# Patient Record
Sex: Female | Born: 1990 | Race: White | Hispanic: No | Marital: Married | Smoking: Never smoker
Health system: Southern US, Community
[De-identification: ages and names within clinical notes are randomized; demographics above are authoritative.]

## PROBLEM LIST (undated history)

## (undated) DIAGNOSIS — O021 Missed abortion: Secondary | ICD-10-CM

## (undated) DIAGNOSIS — Z973 Presence of spectacles and contact lenses: Secondary | ICD-10-CM

## (undated) HISTORY — PX: MOUTH SURGERY: SHX715

## (undated) HISTORY — PX: SHOULDER SURGERY: SHX246

---

## 2011-09-06 ENCOUNTER — Emergency Department (HOSPITAL_BASED_OUTPATIENT_CLINIC_OR_DEPARTMENT_OTHER)
Admission: EM | Admit: 2011-09-06 | Discharge: 2011-09-06 | Disposition: A | Discharge status: Home / Self Care | Payer: No Typology Code available for payment source | Attending: Emergency Medicine | Admitting: Emergency Medicine

## 2011-09-06 ENCOUNTER — Emergency Department (HOSPITAL_BASED_OUTPATIENT_CLINIC_OR_DEPARTMENT_OTHER): Payer: No Typology Code available for payment source

## 2011-09-06 ENCOUNTER — Encounter (HOSPITAL_BASED_OUTPATIENT_CLINIC_OR_DEPARTMENT_OTHER): Payer: Self-pay | Admitting: Emergency Medicine

## 2011-09-06 DIAGNOSIS — S52613A Displaced fracture of unspecified ulna styloid process, initial encounter for closed fracture: Secondary | ICD-10-CM

## 2011-09-06 DIAGNOSIS — Y93I9 Activity, other involving external motion: Secondary | ICD-10-CM | POA: Insufficient documentation

## 2011-09-06 DIAGNOSIS — Y998 Other external cause status: Secondary | ICD-10-CM | POA: Insufficient documentation

## 2011-09-06 DIAGNOSIS — S52609A Unspecified fracture of lower end of unspecified ulna, initial encounter for closed fracture: Secondary | ICD-10-CM | POA: Insufficient documentation

## 2011-09-06 MED ORDER — HYDROCODONE-ACETAMINOPHEN 5-325 MG PO TABS: ORAL_TABLET | ORAL | Status: DC

## 2011-09-06 MED ORDER — IBUPROFEN 800 MG PO TABS: 800.0000 mg | ORAL_TABLET | Freq: Three times a day (TID) | ORAL | Status: AC | PRN

## 2011-09-06 MED ORDER — IBUPROFEN 800 MG PO TABS
800.0000 mg | ORAL_TABLET | Freq: Once | ORAL | Status: AC
  Administered 2011-09-06: 800 mg via ORAL
  Filled 2011-09-06: qty 1

## 2011-09-06 NOTE — ED Notes (Signed)
Patient transported to X-ray 
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
## 2013-01-09 HISTORY — PX: SHOULDER SURGERY: SHX246
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
## 2014-03-25 ENCOUNTER — Emergency Department (HOSPITAL_BASED_OUTPATIENT_CLINIC_OR_DEPARTMENT_OTHER): Payer: BC Managed Care – PPO

## 2014-03-25 ENCOUNTER — Emergency Department (HOSPITAL_BASED_OUTPATIENT_CLINIC_OR_DEPARTMENT_OTHER)
Admission: EM | Admit: 2014-03-25 | Discharge: 2014-03-25 | Disposition: A | Discharge status: Home / Self Care | Payer: BC Managed Care – PPO | Attending: Emergency Medicine | Admitting: Emergency Medicine

## 2014-03-25 ENCOUNTER — Encounter (HOSPITAL_BASED_OUTPATIENT_CLINIC_OR_DEPARTMENT_OTHER): Payer: Self-pay

## 2014-03-25 DIAGNOSIS — W548XXA Other contact with dog, initial encounter: Secondary | ICD-10-CM | POA: Insufficient documentation

## 2014-03-25 DIAGNOSIS — Y93K1 Activity, walking an animal: Secondary | ICD-10-CM | POA: Diagnosis not present

## 2014-03-25 DIAGNOSIS — Z79899 Other long term (current) drug therapy: Secondary | ICD-10-CM | POA: Diagnosis not present

## 2014-03-25 DIAGNOSIS — S4991XA Unspecified injury of right shoulder and upper arm, initial encounter: Secondary | ICD-10-CM | POA: Diagnosis present

## 2014-03-25 DIAGNOSIS — S43101A Unspecified dislocation of right acromioclavicular joint, initial encounter: Secondary | ICD-10-CM

## 2014-03-25 DIAGNOSIS — Y9289 Other specified places as the place of occurrence of the external cause: Secondary | ICD-10-CM | POA: Diagnosis not present

## 2014-03-25 DIAGNOSIS — Y998 Other external cause status: Secondary | ICD-10-CM | POA: Insufficient documentation

## 2014-03-25 DIAGNOSIS — S43121A Dislocation of right acromioclavicular joint, 100%-200% displacement, initial encounter: Secondary | ICD-10-CM | POA: Insufficient documentation

## 2014-03-25 DIAGNOSIS — W19XXXA Unspecified fall, initial encounter: Secondary | ICD-10-CM

## 2014-03-25 MED ORDER — HYDROCODONE-ACETAMINOPHEN 5-325 MG PO TABS
1.0000 | ORAL_TABLET | Freq: Once | ORAL | Status: AC
  Administered 2014-03-25: 1 via ORAL
  Filled 2014-03-25: qty 1

## 2014-03-25 MED ORDER — HYDROCODONE-ACETAMINOPHEN 5-325 MG PO TABS: ORAL_TABLET | ORAL | Status: DC

## 2014-03-25 NOTE — Discharge Instructions (Signed)
Acromioclavicular Injuries °The AC (acromioclavicular) joint is the joint in the shoulder where the collarbone (clavicle) meets the shoulder blade (scapula). The part of the shoulder blade connected to the collarbone is called the acromion. Common problems with and treatments for the AC joint are detailed below. °ARTHRITIS °Arthritis occurs when the joint has been injured and the smooth padding between the joints (cartilage) is lost. This is the wear and tear seen in most joints of the body if they have been overused. This causes the joint to produce pain and swelling which is worse with activity.  °AC JOINT SEPARATION °AC joint separation means that the ligaments connecting the acromion of the shoulder blade and collarbone have been damaged, and the two bones no longer line up. AC separations can be anywhere from mild to severe, and are "graded" depending upon which ligaments are torn and how badly they are torn. °· Grade I Injury: the least damage is done, and the AC joint still lines up. °· Grade II Injury: damage to the ligaments which reinforce the AC joint. In a Grade II injury, these ligaments are stretched but not entirely torn. When stressed, the AC joint becomes painful and unstable. °· Grade III Injury: AC and secondary ligaments are completely torn, and the collarbone is no longer attached to the shoulder blade. This results in deformity; a prominence of the end of the clavicle. °AC JOINT FRACTURE °AC joint fracture means that there has been a break in the bones of the AC joint, usually the end of the clavicle. °TREATMENT °TREATMENT OF AC ARTHRITIS °· There is currently no way to replace the cartilage damaged by arthritis. The best way to improve the condition is to decrease the activities which aggravate the problem. Application of ice to the joint helps decrease pain and soreness (inflammation). The use of non-steroidal anti-inflammatory medication is helpful. °· If less conservative measures do not  work, then cortisone shots (injections) may be used. These are anti-inflammatories; they decrease the soreness in the joint and swelling. °· If non-surgical measures fail, surgery may be recommended. The procedure is generally removal of a portion of the end of the clavicle. This is the part of the collarbone closest to your acromion which is stabilized with ligaments to the acromion of the shoulder blade. This surgery may be performed using a tube-like instrument with a light (arthroscope) for looking into a joint. It may also be performed as an open surgery through a small incision by the surgeon. Most patients will have good range of motion within 6 weeks and may return to all activity including sports by 8-12 weeks, barring complications. °TREATMENT OF AN AC SEPARATION °· The initial treatment is to decrease pain. This is best accomplished by immobilizing the arm in a sling and placing an ice pack to the shoulder for 20 to 30 minutes every 2 hours as needed. As the pain starts to subside, it is important to begin moving the fingers, wrist, elbow and eventually the shoulder in order to prevent a stiff or "frozen" shoulder. Instruction on when and how much to move the shoulder will be provided by your caregiver. The length of time needed to regain full motion and function depends on the amount or grade of the injury. Recovery from a Grade I AC separation usually takes 10 to 14 days, whereas a Grade III may take 6 to 8 weeks. °· Grade I and II separations usually do not require surgery. Even Grade III injuries usually allow return to full   activity with few restrictions. Treatment is also based on the activity demands of the injured shoulder. For example, a high level quarterback with an injured throwing arm will receive more aggressive treatment than someone with a desk job who rarely uses his/her arm for strenuous activities. In some cases, a painful lump may persist which could require a later surgery. Surgery  can be very successful, but the benefits must be weighed against the potential risks. °TREATMENT OF AN AC JOINT FRACTURE °Fracture treatment depends on the type of fracture. Sometimes a splint or sling may be all that is required. Other times surgery may be required for repair. This is more frequently the case when the ligaments supporting the clavicle are completely torn. Your caregiver will help you with these decisions and together you can decide what will be the best treatment. °HOME CARE INSTRUCTIONS  °· Apply ice to the injury for 15-20 minutes each hour while awake for 2 days. Put the ice in a plastic bag and place a towel between the bag of ice and skin. °· If a sling has been applied, wear it constantly for as long as directed by your caregiver, even at night. The sling or splint can be removed for bathing or showering or as directed. Be sure to keep the shoulder in the same place as when the sling is on. Do not lift the arm. °· If a figure-of-eight splint has been applied it should be tightened gently by another person every day. Tighten it enough to keep the shoulders held back. Allow enough room to place the index finger between the body and strap. Loosen the splint immediately if there is numbness or tingling in the hands. °· Take over-the-counter or prescription medicines for pain, discomfort or fever as directed by your caregiver. °· If you or your child has received a follow up appointment, it is very important to keep that appointment in order to avoid long term complications, chronic pain or disability. °SEEK MEDICAL CARE IF:  °· The pain is not relieved with medications. °· There is increased swelling or discoloration that continues to get worse rather than better. °· You or your child has been unable to follow up as instructed. °· There is progressive numbness and tingling in the arm, forearm or hand. °SEEK IMMEDIATE MEDICAL CARE IF:  °· The arm is numb, cold or pale. °· There is increasing pain  in the hand, forearm or fingers. °MAKE SURE YOU:  °· Understand these instructions. °· Will watch your condition. °· Will get help right away if you are not doing well or get worse. °Document Released: 10/05/2004 Document Revised: 03/20/2011 Document Reviewed: 03/30/2008 °[employer]® Patient Information ©2015 [employer]. This information is not intended to replace advice given to you by your health care provider. Make sure you discuss any questions you have with your health care provider. ° °

## 2014-03-25 NOTE — ED Notes (Signed)
MD at bedside. 

## 2014-03-25 NOTE — ED Provider Notes (Signed)
CSN: [id number]     Arrival date & time 03/25/14  2024 History  This chart was scribed for Paula Libra, MD by Chestine Spore, ED Scribe. The patient was seen in room MH12/MH12 at 11:04 PM.     Chief Complaint  Patient presents with  . Shoulder Injury     The history is provided by the patient. No language interpreter was used.    HPI Comments: Stephanie Fritz is a 24 y.o. female who presents to the Emergency Department complaining of right shoulder injury at 7:50 PM. Pt was walking her dog when he pulled her down. She is now having moderate to severe pain in the right shoulder, most notably in the [facility] joint, worse with palpation or attempted movement. There is a stepoff present. Pt is not able to move her right shoulder because of the pain. She has abrasions of both shoulders. She denies hitting her head, neck pain, and any other symptoms. Pt denies having an orthopedist.   History reviewed. No pertinent past medical history. History reviewed. No pertinent past surgical history. History reviewed. No pertinent family history. History  Substance Use Topics  . Smoking status: Never Smoker   . Smokeless tobacco: Not on file  . Alcohol Use: Yes   OB History    No data available     Review of Systems  Skin: Positive for wound (abrasions to bilateral shoulders).      Allergies  Review of patient's allergies indicates no known allergies.  Home Medications   Prior to Admission medications   Medication Sig Start Date End Date Taking? Authorizing Provider  Lisdexamfetamine Dimesylate (VYVANSE PO) Take by mouth.   Yes Historical Provider, MD  Norgestimate-Ethinyl Estradiol Triphasic (TRINESSA, 28,) 0.18/0.215/0.25 MG-35 MCG tablet Take 1 tablet by mouth daily.   Yes Historical Provider, MD  HYDROcodone-acetaminophen (NORCO/VICODIN) 5-325 MG per tablet Take 1-2 tabs po q 6 hours PRN pain 03/25/14   Charnell Peplinski, MD   BP 117/83 mmHg  Pulse 89  Temp(Src) 98.1 F (36.7 C) (Oral)  Resp  18  Ht  (1.651 m)  Wt 135 lb (61.236 kg)  BMI 22.47 kg/m2  SpO2 100%  LMP 03/01/2014  Physical Exam  General: Well-developed, well-nourished female in no acute distress; appearance consistent with age of record HENT: normocephalic; atraumatic Eyes: pupils equal, round and reactive to light; extraocular muscles intact Neck: supple. No C-spine tenderness.  Heart: regular rate and rhythm. Lungs: clear to auscultation bilaterally Abdomen: soft; nondistended; nontender; no masses or hepatosplenomegaly; bowel sounds present Extremities: No deformity; full range of motion; pulses normal Neurologic: Awake, alert and oriented; motor function intact in all extremities and symmetric; no facial droop Skin: Warm and dry. Abrasions over both shoulders right greater than left. Psychiatric: Flat affect   ED Course  Procedures (including critical care time) DIAGNOSTIC STUDIES: Oxygen Saturation is 100% on RA, normal by my interpretation.    COORDINATION OF CARE: 11:11 PM-Discussed treatment plan which includes referral to orthopedist, pain medication, right shoulder X-ray with pt at bedside and pt agreed to plan.    MDM  Nursing notes and vitals signs, including pulse oximetry, reviewed.  Summary of this visit's results, reviewed by myself:  Labs:  No results found for this or any previous visit (from the past 24 hour(s)).  Imaging Studies: Dg Shoulder Right  03/25/2014   CLINICAL DATA:  Pulled down by dog; right shoulder pain. Initial encounter.  EXAM: RIGHT SHOULDER - 2+ VIEW  COMPARISON:  None.  FINDINGS:  There is no evidence of fracture. There appears to be right acromioclavicular joint separation, with the distal right clavicle elevated just above the superior border of the acromion, reflecting a borderline [place] type III acromioclavicular joint injury.  The right humeral head is seated within the glenoid fossa. No significant soft tissue abnormalities are seen. The visualized  portions of the right lung are clear.  IMPRESSION: No evidence of fracture. Apparent borderline [place] type III acromioclavicular joint injury noted.   Electronically Signed   By: Roanna RaiderJeffery  Chang M.D.   On: 03/25/2014 21:16    Final diagnoses:  Acromioclavicular joint separation, type 3, right, initial encounter   I personally performed the services described in this documentation, which was scribed in my presence. The recorded information has been reviewed and is accurate.   Paula LibraJohn Skyelar Swigart, MD 03/25/14 [phone]

## 2014-03-25 NOTE — ED Notes (Signed)
Pt reports she was walking her dog when he pulled her down - pt now c/o right shoulder pain.
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
## 2015-06-22 LAB — OB RESULTS CONSOLE GC/CHLAMYDIA
CHLAMYDIA, DNA PROBE: NEGATIVE
GC PROBE AMP, GENITAL: NEGATIVE

## 2015-06-22 LAB — OB RESULTS CONSOLE HIV ANTIBODY (ROUTINE TESTING): HIV: NONREACTIVE

## 2015-06-22 LAB — OB RESULTS CONSOLE HEPATITIS B SURFACE ANTIGEN: HEP B S AG: NEGATIVE

## 2015-06-22 LAB — OB RESULTS CONSOLE RUBELLA ANTIBODY, IGM: Rubella: IMMUNE

## 2015-06-22 LAB — OB RESULTS CONSOLE RPR: RPR: NONREACTIVE

## 2015-12-08 ENCOUNTER — Encounter (HOSPITAL_COMMUNITY): Payer: Self-pay

## 2015-12-08 ENCOUNTER — Inpatient Hospital Stay (HOSPITAL_COMMUNITY)
Admission: AD | Admit: 2015-12-08 | Discharge: 2015-12-11 | DRG: 775 | Disposition: A | Discharge status: Home / Self Care | Payer: BC Managed Care – PPO | Source: Ambulatory Visit | Attending: Obstetrics and Gynecology | Admitting: Obstetrics and Gynecology

## 2015-12-08 DIAGNOSIS — O42919 Preterm premature rupture of membranes, unspecified as to length of time between rupture and onset of labor, unspecified trimester: Secondary | ICD-10-CM | POA: Diagnosis present

## 2015-12-08 DIAGNOSIS — O42913 Preterm premature rupture of membranes, unspecified as to length of time between rupture and onset of labor, third trimester: Principal | ICD-10-CM | POA: Diagnosis present

## 2015-12-08 DIAGNOSIS — Z3A36 36 weeks gestation of pregnancy: Secondary | ICD-10-CM

## 2015-12-08 LAB — URINE MICROSCOPIC-ADD ON

## 2015-12-08 LAB — URINALYSIS, ROUTINE W REFLEX MICROSCOPIC
BILIRUBIN URINE: NEGATIVE
Glucose, UA: NEGATIVE mg/dL
KETONES UR: NEGATIVE mg/dL
Leukocytes, UA: NEGATIVE
NITRITE: NEGATIVE
PROTEIN: 30 mg/dL — AB
Specific Gravity, Urine: <1.005 — ABNORMAL LOW (ref 1.005–1.030)
pH: 6 (ref 5.0–8.0)

## 2015-12-08 LAB — GROUP B STREP BY PCR: GROUP B STREP BY PCR: NEGATIVE

## 2015-12-08 LAB — OB RESULTS CONSOLE GBS: STREP GROUP B AG: NEGATIVE

## 2015-12-08 NOTE — MAU Note (Signed)
Pt states that she felt a gush of fluid about an hour ago-clear, and continues to leak. Denies contractions, vag bleeding. +FM

## 2015-12-08 NOTE — MAU Note (Signed)
Dr. Callahan at bedside. 

## 2015-12-08 NOTE — MAU Note (Signed)
Dr Claiborne Billingsallahan will come to see patient in MAU. Orders to collect rapid GBS given

## 2015-12-09 ENCOUNTER — Inpatient Hospital Stay (HOSPITAL_COMMUNITY): Payer: BC Managed Care – PPO | Admitting: Anesthesiology

## 2015-12-09 ENCOUNTER — Encounter (HOSPITAL_COMMUNITY): Payer: Self-pay | Admitting: Anesthesiology

## 2015-12-09 DIAGNOSIS — O42919 Preterm premature rupture of membranes, unspecified as to length of time between rupture and onset of labor, unspecified trimester: Secondary | ICD-10-CM | POA: Diagnosis present

## 2015-12-09 DIAGNOSIS — Z3A36 36 weeks gestation of pregnancy: Secondary | ICD-10-CM | POA: Diagnosis not present

## 2015-12-09 DIAGNOSIS — O42913 Preterm premature rupture of membranes, unspecified as to length of time between rupture and onset of labor, third trimester: Secondary | ICD-10-CM | POA: Diagnosis present

## 2015-12-09 LAB — CBC
HEMATOCRIT: 32.8 % — AB (ref 36.0–46.0)
Hemoglobin: 11.7 g/dL — ABNORMAL LOW (ref 12.0–15.0)
MCH: 32.1 pg (ref 26.0–34.0)
MCHC: 35.7 g/dL (ref 30.0–36.0)
MCV: 89.9 fL (ref 78.0–100.0)
PLATELETS: 207 10*3/uL (ref 150–400)
RBC: 3.65 MIL/uL — AB (ref 3.87–5.11)
RDW: 12.6 % (ref 11.5–15.5)
WBC: 10.9 10*3/uL — ABNORMAL HIGH (ref 4.0–10.5)

## 2015-12-09 LAB — ABO/RH: ABO/RH(D): AB POS

## 2015-12-09 LAB — TYPE AND SCREEN
ABO/RH(D): AB POS
Antibody Screen: NEGATIVE

## 2015-12-09 LAB — RPR: RPR Ser Ql: NONREACTIVE

## 2015-12-09 MED ORDER — IBUPROFEN 800 MG PO TABS
800.0000 mg | ORAL_TABLET | Freq: Three times a day (TID) | ORAL | Status: DC
  Administered 2015-12-09 – 2015-12-11 (×4): 800 mg via ORAL
  Filled 2015-12-09 (×7): qty 1

## 2015-12-09 MED ORDER — METHYLERGONOVINE MALEATE 0.2 MG/ML IJ SOLN: 0.2000 mg | INTRAMUSCULAR | Status: DC | PRN

## 2015-12-09 MED ORDER — OXYCODONE-ACETAMINOPHEN 5-325 MG PO TABS: 1.0000 | ORAL_TABLET | ORAL | Status: DC | PRN

## 2015-12-09 MED ORDER — WITCH HAZEL-GLYCERIN EX PADS: 1.0000 "application " | MEDICATED_PAD | CUTANEOUS | Status: DC | PRN

## 2015-12-09 MED ORDER — BETAMETHASONE SOD PHOS & ACET 6 (3-3) MG/ML IJ SUSP
12.0000 mg | Freq: Once | INTRAMUSCULAR | Status: AC
  Administered 2015-12-09: 12 mg via INTRAMUSCULAR
  Filled 2015-12-09: qty 2

## 2015-12-09 MED ORDER — OXYTOCIN 40 UNITS IN LACTATED RINGERS INFUSION - SIMPLE MED: 2.5000 [IU]/h | INTRAVENOUS | Status: DC

## 2015-12-09 MED ORDER — ONDANSETRON HCL 4 MG/2ML IJ SOLN: 4.0000 mg | Freq: Four times a day (QID) | INTRAMUSCULAR | Status: DC | PRN

## 2015-12-09 MED ORDER — ONDANSETRON HCL 4 MG/2ML IJ SOLN: 4.0000 mg | INTRAMUSCULAR | Status: DC | PRN

## 2015-12-09 MED ORDER — SODIUM CHLORIDE 0.9 % IV SOLN: 250.0000 mL | INTRAVENOUS | Status: DC | PRN

## 2015-12-09 MED ORDER — OXYTOCIN BOLUS FROM INFUSION
500.0000 mL | Freq: Once | INTRAVENOUS | Status: AC
  Administered 2015-12-09: 500 mL via INTRAVENOUS

## 2015-12-09 MED ORDER — MAGNESIUM HYDROXIDE 400 MG/5ML PO SUSP: 30.0000 mL | ORAL | Status: DC | PRN

## 2015-12-09 MED ORDER — FERROUS SULFATE 325 (65 FE) MG PO TABS
325.0000 mg | ORAL_TABLET | Freq: Two times a day (BID) | ORAL | Status: DC
  Administered 2015-12-09 – 2015-12-10 (×3): 325 mg via ORAL
  Filled 2015-12-09 (×3): qty 1

## 2015-12-09 MED ORDER — LIDOCAINE HCL (PF) 1 % IJ SOLN
INTRAMUSCULAR | Status: DC | PRN
Start: 2015-12-09 — End: 2015-12-09
  Administered 2015-12-09 (×2): 7 mL via EPIDURAL

## 2015-12-09 MED ORDER — SODIUM CHLORIDE 0.9% FLUSH
3.0000 mL | Freq: Two times a day (BID) | INTRAVENOUS | Status: DC
  Administered 2015-12-09: 3 mL via INTRAVENOUS

## 2015-12-09 MED ORDER — MEASLES, MUMPS & RUBELLA VAC ~~LOC~~ INJ: 0.5000 mL | INJECTION | Freq: Once | SUBCUTANEOUS | Status: DC

## 2015-12-09 MED ORDER — ACETAMINOPHEN 325 MG PO TABS: 650.0000 mg | ORAL_TABLET | ORAL | Status: DC | PRN

## 2015-12-09 MED ORDER — DIPHENHYDRAMINE HCL 25 MG PO CAPS: 25.0000 mg | ORAL_CAPSULE | Freq: Four times a day (QID) | ORAL | Status: DC | PRN

## 2015-12-09 MED ORDER — LACTATED RINGERS IV SOLN
500.0000 mL | Freq: Once | INTRAVENOUS | Status: AC
  Administered 2015-12-09: 500 mL via INTRAVENOUS

## 2015-12-09 MED ORDER — ONDANSETRON HCL 4 MG PO TABS: 4.0000 mg | ORAL_TABLET | ORAL | Status: DC | PRN

## 2015-12-09 MED ORDER — OXYTOCIN 40 UNITS IN LACTATED RINGERS INFUSION - SIMPLE MED
1.0000 m[IU]/min | INTRAVENOUS | Status: DC
  Administered 2015-12-09: 2 m[IU]/min via INTRAVENOUS
  Filled 2015-12-09: qty 1000

## 2015-12-09 MED ORDER — ZOLPIDEM TARTRATE 5 MG PO TABS: 5.0000 mg | ORAL_TABLET | Freq: Every evening | ORAL | Status: DC | PRN

## 2015-12-09 MED ORDER — FENTANYL 2.5 MCG/ML BUPIVACAINE 1/10 % EPIDURAL INFUSION (WH - ANES)
14.0000 mL/h | INTRAMUSCULAR | Status: DC | PRN
  Administered 2015-12-09: 14 mL/h via EPIDURAL
  Filled 2015-12-09: qty 100

## 2015-12-09 MED ORDER — BENZOCAINE-MENTHOL 20-0.5 % EX AERO
1.0000 | INHALATION_SPRAY | CUTANEOUS | Status: DC | PRN
Start: 2015-12-09 — End: 2015-12-11
  Administered 2015-12-09: 1 via TOPICAL
  Filled 2015-12-09: qty 56

## 2015-12-09 MED ORDER — COCONUT OIL OIL: 1.0000 "application " | TOPICAL_OIL | Status: DC | PRN

## 2015-12-09 MED ORDER — SENNOSIDES-DOCUSATE SODIUM 8.6-50 MG PO TABS
2.0000 | ORAL_TABLET | ORAL | Status: DC
  Administered 2015-12-09 – 2015-12-10 (×2): 2 via ORAL
  Filled 2015-12-09 (×2): qty 2

## 2015-12-09 MED ORDER — TERBUTALINE SULFATE 1 MG/ML IJ SOLN
0.2500 mg | Freq: Once | INTRAMUSCULAR | Status: DC | PRN
  Filled 2015-12-09: qty 1

## 2015-12-09 MED ORDER — FLEET ENEMA 7-19 GM/118ML RE ENEM: 1.0000 | ENEMA | RECTAL | Status: DC | PRN

## 2015-12-09 MED ORDER — DIPHENHYDRAMINE HCL 50 MG/ML IJ SOLN: 12.5000 mg | INTRAMUSCULAR | Status: DC | PRN

## 2015-12-09 MED ORDER — EPHEDRINE 5 MG/ML INJ
10.0000 mg | INTRAVENOUS | Status: DC | PRN
  Filled 2015-12-09: qty 4

## 2015-12-09 MED ORDER — DIBUCAINE 1 % RE OINT: 1.0000 "application " | TOPICAL_OINTMENT | RECTAL | Status: DC | PRN

## 2015-12-09 MED ORDER — LACTATED RINGERS IV SOLN
500.0000 mL | INTRAVENOUS | Status: DC | PRN
  Administered 2015-12-09: 250 mL via INTRAVENOUS

## 2015-12-09 MED ORDER — TETANUS-DIPHTH-ACELL PERTUSSIS 5-2.5-18.5 LF-MCG/0.5 IM SUSP: 0.5000 mL | Freq: Once | INTRAMUSCULAR | Status: DC

## 2015-12-09 MED ORDER — LIDOCAINE HCL (PF) 1 % IJ SOLN
30.0000 mL | INTRAMUSCULAR | Status: DC | PRN
  Filled 2015-12-09: qty 30

## 2015-12-09 MED ORDER — OXYCODONE-ACETAMINOPHEN 5-325 MG PO TABS: 2.0000 | ORAL_TABLET | ORAL | Status: DC | PRN

## 2015-12-09 MED ORDER — PHENYLEPHRINE 40 MCG/ML (10ML) SYRINGE FOR IV PUSH (FOR BLOOD PRESSURE SUPPORT)
80.0000 ug | PREFILLED_SYRINGE | INTRAVENOUS | Status: DC | PRN
  Filled 2015-12-09: qty 5
  Filled 2015-12-09: qty 10

## 2015-12-09 MED ORDER — LACTATED RINGERS IV SOLN
INTRAVENOUS | Status: DC
  Administered 2015-12-08 – 2015-12-09 (×2): via INTRAVENOUS

## 2015-12-09 MED ORDER — METHYLERGONOVINE MALEATE 0.2 MG PO TABS
0.2000 mg | ORAL_TABLET | ORAL | Status: DC | PRN
Start: 2015-12-09 — End: 2015-12-11

## 2015-12-09 MED ORDER — PRENATAL MULTIVITAMIN CH
1.0000 | ORAL_TABLET | Freq: Every day | ORAL | Status: DC
  Administered 2015-12-10 – 2015-12-11 (×2): 1 via ORAL
  Filled 2015-12-09 (×2): qty 1

## 2015-12-09 MED ORDER — PHENYLEPHRINE 40 MCG/ML (10ML) SYRINGE FOR IV PUSH (FOR BLOOD PRESSURE SUPPORT)
80.0000 ug | PREFILLED_SYRINGE | INTRAVENOUS | Status: DC | PRN
  Filled 2015-12-09: qty 5

## 2015-12-09 MED ORDER — SOD CITRATE-CITRIC ACID 500-334 MG/5ML PO SOLN: 30.0000 mL | ORAL | Status: DC | PRN

## 2015-12-09 MED ORDER — SODIUM CHLORIDE 0.9% FLUSH: 3.0000 mL | INTRAVENOUS | Status: DC | PRN

## 2015-12-09 MED ORDER — SIMETHICONE 80 MG PO CHEW: 80.0000 mg | CHEWABLE_TABLET | ORAL | Status: DC | PRN

## 2015-12-09 NOTE — Anesthesia Postprocedure Evaluation (Signed)
Anesthesia Post Note  Patient: Stephanie Fritz  Procedure(s) Performed: * No procedures listed *  Patient location during evaluation: Mother Baby Anesthesia Type: Epidural Level of consciousness: awake and alert, oriented and patient cooperative Pain management: pain level controlled Vital Signs Assessment: post-procedure vital signs reviewed and stable Respiratory status: spontaneous breathing Cardiovascular status: stable Postop Assessment: no headache, epidural receding, patient able to bend at knees and no signs of nausea or vomiting Anesthetic complications: no Comments: Pain score 0.     Last Vitals:  Vitals:   12/09/15 1030 12/09/15 1130  BP: (!) 115/59 115/64  Pulse: (!) 59 63  Resp: 18 18  Temp: 36.4 C 36.8 C    Last Pain:  Vitals:   12/09/15 1130  TempSrc: Oral  PainSc:    Pain Goal:                 [facility], IncWRINKLE,Makinze Jani

## 2015-12-09 NOTE — Lactation Note (Signed)
This note was copied from a baby's chart. Lactation Consultation Note Initial visit at less than an hour of age.  Mom holding baby STS for feeding with L&D RN assisting.  Baby is in football hold getting sleepy after RN reports eagerness to latch.  Mom has short shaft everted nipples with semi compressible breasts, slightly more compressible on left breast.   LC assisted with hand expression and spoon fed baby 4mls of EBM.  Baby extends tongue with heart shaped tip noted.  Baby back to breast in cross cradle hold on left breast.  Baby is only sucking a few times and slipping off.  LC noted clear mucus mouth and may be causing baby to slip.  LC mentioned to mom about use of NS if baby is not maintaining latch.   Late preterm policy dicussed with parent information sheet given.   Mom and FOB aware to wake baby for feedings as needed and allow STS.  Mom to hand express and watch for active feedings.  Mom to post pump with DEBP and then hand express and offer all EBM to baby.  Mom to call RN for assist if baby is not doing well for a feeding.   [facility] given and discussed.  Encouraged to feed with early cues on demand.  Early newborn behavior discussed.  Hand expression demonstrated by mom.  Mom to call for assist as needed.    Patient Name: Stephanie Fritz ZOXWR'UToday's Date: 12/09/2015 Reason for consult: Initial assessment;Late preterm infant;Infant < 6lbs   Maternal Data Has patient been taught Hand Expression?: Yes Does the patient have breastfeeding experience prior to this delivery?: No  Feeding Feeding Type: Breast Fed Length of feed:  (several minutes)  LATCH Score/Interventions Latch: Repeated attempts needed to sustain latch, nipple held in mouth throughout feeding, stimulation needed to elicit sucking reflex. Intervention(s): Adjust position;Assist with latch;Breast massage  Audible Swallowing: A few with stimulation Intervention(s): Skin to skin  Type of Nipple:  Flat  Comfort (Breast/Nipple): Soft / non-tender     Hold (Positioning): Full assist, staff holds infant at breast Intervention(s): Support Pillows;Position options;Skin to skin;Breastfeeding basics reviewed  LATCH Score: 5  Lactation Tools Discussed/Used Pump Review: Setup, frequency, and cleaning;Milk Storage Initiated by:: RN, Asher MuirJamie to set up when mom arrives to Rm [id number] Date initiated:: 12/09/15   Consult Status Consult Status: Follow-up Date: 12/10/15 Follow-up type: In-patient    Jannifer RodneyShoptaw, Tashya Alberty Lynn 12/09/2015, 10:24 AM

## 2015-12-09 NOTE — H&P (Signed)
25 y.o. 7047w3d  G1P0 comes in c/o LOF.  Otherwise has good fetal movement and no bleeding.  History reviewed. No pertinent past medical history.  Past Surgical History:  Procedure Laterality Date  . MOUTH SURGERY    . SHOULDER SURGERY      OB History  Gravida Para Term Preterm AB Living  1            SAB TAB Ectopic Multiple Live Births               # Outcome Date GA Lbr Len/2nd Weight Sex Delivery Anes PTL Lv  1 Current               Social History   Social History  . Marital status: Married    Spouse name: N/A  . Number of children: N/A  . Years of education: N/A   Occupational History  . Not on file.   Social History Main Topics  . Smoking status: Never Smoker  . Smokeless tobacco: Never Used  . Alcohol use Yes  . Drug use: No  . Sexual activity: Yes   Other Topics Concern  . Not on file   Social History Narrative  . No narrative on file   Patient has no known allergies.    Prenatal Transfer Tool  Maternal Diabetes: No Genetic Screening: Normal Maternal Ultrasounds/Referrals: Normal Fetal Ultrasounds or other Referrals:  None Maternal Substance Abuse:  No Significant Maternal Medications:  None Significant Maternal Lab Results: Lab values include: Group B Strep negative  By rapid PCR Other PNC: uncomplicated.    Vitals:   12/08/15 2215 12/08/15 2353  BP: 137/83 121/83  Pulse: 85 95  Resp: 18   Temp: 98.6 F (37 C)      Lungs/Cor:  NAD Abdomen:  soft, gravid Ex:  no cords, erythema SVE:  pending FHTs:  140, good STV, NST R Toco:  none   A/P   Admit to L&D for IOL d/t PPROM at 36.2  Betamethasone course  GBS Neg by rapid PCR  Pitocin 2x2  NO3 or epidural upon request  Discussed with pt closed NICU and possible need for transfer of baby after delivery if any complications of PTB.  Pt understands and declines transfer to hospital with open NICU.  Philip AspenALLAHAN, Stephanie Fritz

## 2015-12-09 NOTE — Anesthesia Pain Management Evaluation Note (Signed)
  CRNA Pain Management Visit Note  Patient: Stephanie Fritz, 25 y.o., female  "Hello I am a member of the anesthesia team [facility]. We have an anesthesia team available at all times to provide care throughout the hospital, including epidural management and anesthesia for C-section. I don't know your plan for the delivery whether it a natural birth, water birth, IV sedation, nitrous supplementation, doula or epidural, but we want to meet your pain goals."   1.Was your pain managed to your expectations on prior hospitalizations?   No prior hospitalizations  2.What is your expectation for pain management during this hospitalization?     Epidural  3.How can we help you reach that goal? Epidural  Record the patient's initial score and the patient's pain goal.   Pain: 2  Pain Goal: 5 [facility] wants you to be able to say your pain was always managed very well.  Brylin Stanislawski 12/09/2015

## 2015-12-09 NOTE — Lactation Note (Signed)
This note was copied from a baby's chart. Lactation Consultation Note  Patient Name: Stephanie Cyd Silencelexandra Bergquist WUJWJ'XToday's Date: 12/09/2015 Reason for consult: Follow-up assessment Baby at 14 hr of life. Parents requesting help latching baby. Upon entry baby was in perfect cross cradle position sleeping. Mom stated that baby has had a bath and will not wake to feed. Showed parents how to wake baby. Applied the #20 NS with the 39Fr and baby was able to take all 7ml of formula and was still happily latched. Left baby at the breast with instructions for parents to take him off after 30 minutes unless he comes off before then.   Maternal Data    Feeding Feeding Type: Breast Fed  LATCH Score/Interventions                      Lactation Tools Discussed/Used Tools: Nipple Shields;39F feeding tube / Syringe Nipple shield size: 20   Consult Status Consult Status: Follow-up Date: 12/10/15 Follow-up type: In-patient    [name] 12/09/2015, 10:47 PM

## 2015-12-09 NOTE — Lactation Note (Signed)
This note was copied from a baby's chart. Lactation Consultation Note  Patient Name: Stephanie Fritz ZOXWR'UToday's Date: 12/09/2015 Reason for consult: Follow-up assessment    With this mom of a LPI, now 6 hours old. Mom has flat nipples, and baby is having trouble latching. I brought mom both 16 and 20 nipple shields, and Elizabeth simmons, LC is going to show mom how to use this tool at 6 o"clock feeding. I also fitted mom with 21 flanges, showed her how to set initiation setting on the pump, and explained that with colostrum, hand expression after pumping is necessary to express , due to how thick it is. Parents very tired, to swaddle baby and place in crib, and rest themselves, and call lactation for next feeding. Lactation phone # left on eraser board.   Maternal Data    Feeding Feeding Type: Breast Fed  LATCH Score/Interventions Latch: Too sleepy or reluctant, no latch achieved, no sucking elicited. Intervention(s): Adjust position;Assist with latch  Audible Swallowing: None Intervention(s): Skin to skin;Hand expression Intervention(s): Hand expression;Skin to skin;Alternate breast massage  Type of Nipple: (S) Flat (I brought mom a 16 and 20 nipple shield, and Lanora Manislizabeth, [place] will work with this dyad at 6 pm feeding) Intervention(s): Double electric pump  Comfort (Breast/Nipple): Soft / non-tender     Hold (Positioning): Assistance needed to correctly position infant at breast and maintain latch. Intervention(s): Breastfeeding basics reviewed;Support Pillows;Position options;Skin to skin  LATCH Score: 4  Lactation Tools Discussed/Used Tools: Flanges Flange Size:  (decreased mom to 21 flanges with a good fit) Pump Review:  (initiation setting and hand expression taught)   Consult Status Consult Status: Follow-up Date: 12/09/15 Follow-up type: In-patient    Alfred LevinsLee, Anacleto Batterman Anne 12/09/2015, 3:46 PM

## 2015-12-09 NOTE — Anesthesia Preprocedure Evaluation (Signed)

## 2015-12-09 NOTE — Anesthesia Procedure Notes (Signed)
Epidural Patient location during procedure: OB Start time: 12/09/2015 6:34 AM End time: 12/09/2015 6:38 AM  Staffing Anesthesiologist: Leilani AbleHATCHETT, Maxine Fredman Performed: anesthesiologist   Preanesthetic Checklist Completed: patient identified, surgical consent, pre-op evaluation, timeout performed, IV checked, risks and benefits discussed and monitors and equipment checked  Epidural Patient position: sitting Prep: site prepped and draped and DuraPrep Patient monitoring: continuous pulse ox and blood pressure Approach: midline Location: L3-L4 Injection technique: LOR air  Needle:  Needle type: Tuohy  Needle gauge: 17 G Needle length: 9 cm and 9 Needle insertion depth: 6 cm Catheter type: closed end flexible Catheter size: 19 Gauge Catheter at skin depth: 11 cm Test dose: negative and Other  Assessment Sensory level: T9 Events: blood not aspirated, injection not painful, no injection resistance, negative IV test and no paresthesia  Additional Notes Reason for block:procedure for pain

## 2015-12-09 NOTE — Lactation Note (Signed)
This note was copied from a baby's chart. Lactation Consultation Note  Patient Name: Stephanie Fritz ZOXWR'UToday's Date: 12/09/2015 Reason for consult: Follow-up assessment Baby at 10 hr of life. Baby can latch to mom's short shaft nipples but will not maintain latch. Applied #20 NS with 5 Fr tubing. With occasional stimulation baby was able to bf for 15 minutes and took  7 ml of formula. Mom desire to ebf. She understands LPT feeding policy and would prefer to supplement at the breast and post pump. If she is able to express milk she will use that in addition to or in combination with formula per feeding volume guidelines. Mom will offer the breast with supplement q3hr on demand. She is aware of lactation services and will call as needed.   Maternal Data    Feeding Feeding Type: Breast Fed  LATCH Score/Interventions Latch: Repeated attempts needed to sustain latch, nipple held in mouth throughout feeding, stimulation needed to elicit sucking reflex. Intervention(s): Waking techniques Intervention(s): Adjust position;Assist with latch;Breast compression  Audible Swallowing: Spontaneous and intermittent  Type of Nipple: Everted at rest and after stimulation  Comfort (Breast/Nipple): Soft / non-tender     Hold (Positioning): Full assist, staff holds infant at breast Intervention(s): Support Pillows;Position options  LATCH Score: 7  Lactation Tools Discussed/Used Tools: 54F feeding tube / Syringe;Nipple Shields Nipple shield size: 20   Consult Status Consult Status: Follow-up Date: 12/10/15 Follow-up type: In-patient    Rulon Eisenmengerlizabeth E Gerad Cornelio 12/09/2015, 7:21 PM

## 2015-12-10 LAB — CBC
HEMATOCRIT: 29.7 % — AB (ref 36.0–46.0)
Hemoglobin: 10.5 g/dL — ABNORMAL LOW (ref 12.0–15.0)
MCH: 31.7 pg (ref 26.0–34.0)
MCHC: 35.4 g/dL (ref 30.0–36.0)
MCV: 89.7 fL (ref 78.0–100.0)
PLATELETS: 199 10*3/uL (ref 150–400)
RBC: 3.31 MIL/uL — ABNORMAL LOW (ref 3.87–5.11)
RDW: 13.1 % (ref 11.5–15.5)
WBC: 15.2 10*3/uL — ABNORMAL HIGH (ref 4.0–10.5)

## 2015-12-10 NOTE — Lactation Note (Signed)
This note was copied from a baby's chart. Lactation Consultation Note  Returned to room at 1430 to assist with feeding. Parents explained that since the circumcision baby has not woken to eat even when placed skin to skin. They will try again at 1500 and will formula feed if he does not BF. Explained paced bottle feeding. Parents were educated that the goals for now were to feed the baby and protect the milk supply. Understanding verbalized.  Patient Name: Stephanie Fritz: 12/10/2015 Reason for consult: Follow-up assessment   Maternal Data    Feeding Feeding Type: Breast Fed Length of feed: 15 min (witnessed 10 min good rhythm)  LATCH Score/Interventions Latch: Repeated attempts needed to sustain latch, nipple held in mouth throughout feeding, stimulation needed to elicit sucking reflex. Intervention(s): Skin to skin;Teach feeding cues;Waking techniques Intervention(s): Adjust position;Assist with latch;Breast massage;Breast compression  Audible Swallowing: Spontaneous and intermittent (with supplementation; a few with stim once suppl finished) Intervention(s): Skin to skin;Hand expression (swallows after SNS finished with hand exp )  Type of Nipple: Everted at rest and after stimulation  Comfort (Breast/Nipple): Soft / non-tender     Hold (Positioning): Assistance needed to correctly position infant at breast and maintain latch.  LATCH Score: 8  Lactation Tools Discussed/Used Tools: Nipple Shields;82F feeding tube / Syringe   Consult Status      Stephanie Fritz, Stephanie Fritz 12/10/2015, 2:46 PM

## 2015-12-10 NOTE — Lactation Note (Signed)
This note was copied from a baby's chart. Lactation Consultation Note  Parents of 6128 HOL late preterm infant report hearing many swallows with the last BF. Explained that though he may be doing well now it was very likely he would tire out in the next day or 2. Reviewed that LPI guidelines with parents as well as hand expression and use of breast pump and nipple shield. Plan to assess feeding at 2pm.  Patient Name: Stephanie Fritz UEAVW'UToday's Date: 12/10/2015 Reason for consult: Follow-up assessment;Late preterm infant   Maternal Data    Feeding Feeding Type: Breast Fed Length of feed: 15 min (witnessed 10 min good rhythm)  LATCH Score/Interventions Latch: Repeated attempts needed to sustain latch, nipple held in mouth throughout feeding, stimulation needed to elicit sucking reflex. Intervention(s): Skin to skin;Teach feeding cues;Waking techniques Intervention(s): Adjust position;Assist with latch;Breast massage;Breast compression  Audible Swallowing: Spontaneous and intermittent (with supplementation; a few with stim once suppl finished) Intervention(s): Skin to skin;Hand expression (swallows after SNS finished with hand exp )  Type of Nipple: Everted at rest and after stimulation  Comfort (Breast/Nipple): Soft / non-tender     Hold (Positioning): Assistance needed to correctly position infant at breast and maintain latch.  LATCH Score: 8  Lactation Tools Discussed/Used Tools: Nipple Shields;70F feeding tube / Syringe   Consult Status      Soyla DryerJoseph, Kemauri Musa 12/10/2015, 12:39 PM

## 2015-12-10 NOTE — Progress Notes (Addendum)
Post Partum Day 1 Subjective: no complaints, up ad lib, voiding, tolerating PO, + flatus and breast feeding  Objective: Blood pressure 110/65, pulse 63, temperature 98.1 F (36.7 C), temperature source Oral, resp. rate 18, height 5\' 5"  (1.651 m), weight 78.5 kg (173 lb), SpO2 99 %, unknown if currently breastfeeding.  Physical Exam:  General: alert, cooperative and no distress Lochia: appropriate Uterine Fundus: firm perineum: healing well, no significant drainage, no dehiscence, no significant erythema DVT Evaluation: No evidence of DVT seen on physical exam. Negative Homan's sign. No cords or calf tenderness. No significant calf/ankle edema.   Recent Labs  12/08/15 2300 12/10/15 0552  HGB 11.7* 10.5*  HCT 32.8* 29.7*    Assessment/Plan: Plan for discharge tomorrow, Breastfeeding and Circumcision prior to discharge   LOS: 1 day   Shinichi Anguiano STACIA 12/10/2015, 10:18 AM

## 2015-12-10 NOTE — Plan of Care (Signed)
Problem: Education: Goal: Knowledge of condition will improve Mother supplementing with alimentum with SNS and nipple shield. Mother states she is feeling better with feeding but still needs assistance. Encouraged mother to call out with feedings in order for staff to assist with latch.

## 2015-12-11 MED ORDER — COCONUT OIL OIL
TOPICAL_OIL | Status: AC
  Filled 2015-12-11: qty 120

## 2015-12-11 MED ORDER — IBUPROFEN 800 MG PO TABS: 800.0000 mg | ORAL_TABLET | Freq: Three times a day (TID) | ORAL | 3 refills | Status: DC | PRN

## 2015-12-11 NOTE — Progress Notes (Signed)
Post Partum Day 2 Subjective: no complaints, up ad lib, voiding, tolerating PO, + flatus and breast feeding  Objective: Blood pressure 121/87, pulse 71, temperature 98.2 F (36.8 C), temperature source Oral, resp. rate 16, height 5\' 5"  (1.651 m), weight 78.5 kg (173 lb), SpO2 99 %, unknown if currently breastfeeding.  Physical Exam:  General: alert, cooperative and no distress Lochia: appropriate Uterine Fundus: firm perineum: healing well, no significant drainage, no dehiscence, no significant erythema DVT Evaluation: No evidence of DVT seen on physical exam. Negative Homan's sign. No cords or calf tenderness. No significant calf/ankle edema.   Recent Labs  12/08/15 2300 12/10/15 0552  HGB 11.7* 10.5*  HCT 32.8* 29.7*    Assessment/Plan: Discharge home and Breastfeeding   LOS: 2 days   Stephanie Fritz STACIA 12/11/2015, 11:48 AM

## 2015-12-11 NOTE — Lactation Note (Signed)
This note was copied from a baby's chart. Lactation Consultation Note  LPI infant 8426w6d < 5 lbs.  51 hours old. Baby latched w/ #20NS.  Parents are using 5 Fr tubing to supplement at the breast. Mother recently pumped 5 ml of breastmilk. Taught how to inject small amount of breastmilk into NS using curved tip syringe to interest sleepy baby in feeding. Also recommend she hand express drops into NS before latching. Demonstrated how to latch baby w/ 5 FR without using NS.  Baby sustained latch for 5 min without NS. Noted sucking sound due to slight lifting of NS when baby latches w/ 5 FR. Offered different SNS but parents states they would like to continue using 5 Fr SNS. Discussed increased volume and LPI feeding behavior. Encouraged mother to continue post pumping 4-6 times a day for 10-15 min. Give volume back to baby at next feeding. Encouraged mother to compress her breast during feeding. Reviewed engorgement care and monitoring voids/stools. Mom encouraged to feed baby 8-12 times/24 hours and with feeding cues at least q 3 hr. Parents will see Darlin PriestlyBarb Carder IBCL at Pediatricians office on Monday. Provided parents with extra NS and extra tubing.   Patient Name: Stephanie Fritz'UToday's Date: 12/11/2015 Reason for consult: Late preterm infant   Maternal Data    Feeding Feeding Type: Breast Fed Length of feed: 35 min  LATCH Score/Interventions Latch: Repeated attempts needed to sustain latch, nipple held in mouth throughout feeding, stimulation needed to elicit sucking reflex. Intervention(s): Skin to skin;Waking techniques Intervention(s): Adjust position;Assist with latch;Breast massage;Breast compression  Audible Swallowing: Spontaneous and intermittent Intervention(s): Skin to skin;Hand expression  Type of Nipple: Everted at rest and after stimulation Intervention(s): Double electric pump  Comfort (Breast/Nipple): Soft / non-tender     Hold (Positioning):  Assistance needed to correctly position infant at breast and maintain latch.  LATCH Score: 8  Lactation Tools Discussed/Used Tools: Nipple Dorris CarnesShields;Supplemental Nutrition System Nipple shield size: 20 Pump Review: Setup, frequency, and cleaning;Milk Storage   Consult Status Consult Status: Complete    Hardie PulleyBerkelhammer, Cobie Leidner Boschen 12/11/2015, 11:54 AM

## 2015-12-11 NOTE — Discharge Summary (Signed)
Obstetric Discharge Summary Reason for Admission: rupture of membranes Prenatal Procedures: NST and ultrasound Intrapartum Procedures: spontaneous vaginal delivery Postpartum Procedures: none Complications-Operative and Postpartum: none Hemoglobin  Date Value Ref Range Status  12/10/2015 10.5 (L) 12.0 - 15.0 g/dL Final   HCT  Date Value Ref Range Status  12/10/2015 29.7 (L) 36.0 - 46.0 % Final    Physical Exam:  General: alert, cooperative and no distress Lochia: appropriate Uterine Fundus: firm perineum: healing well, no significant drainage, no dehiscence, no significant erythema DVT Evaluation: No evidence of DVT seen on physical exam. Negative Homan's sign. No cords or calf tenderness. No significant calf/ankle edema.  Discharge Diagnoses: Premature labor  Discharge Information: Date: 12/11/2015 Activity: pelvic rest Diet: routine Medications: PNV and Ibuprofen Condition: stable Instructions: refer to practice specific booklet Discharge to: home   Newborn Data: Live born female  Birth Weight: 6 lb 3.7 oz (2825 g) APGAR: 8, 9  Home with mother.  Essie HartINN, Mariette Cowley STACIA 12/11/2015, 11:52 AM

## 2016-03-12 IMAGING — DX DG SHOULDER 2+V*R*
2 series · 2 of 2 positions shown · non-contrast
Comparison: None.

CLINICAL DATA: Pulled down by dog; right shoulder pain. Initial
encounter.

EXAM:
RIGHT SHOULDER - 2+ VIEW

[shoulder grashey]
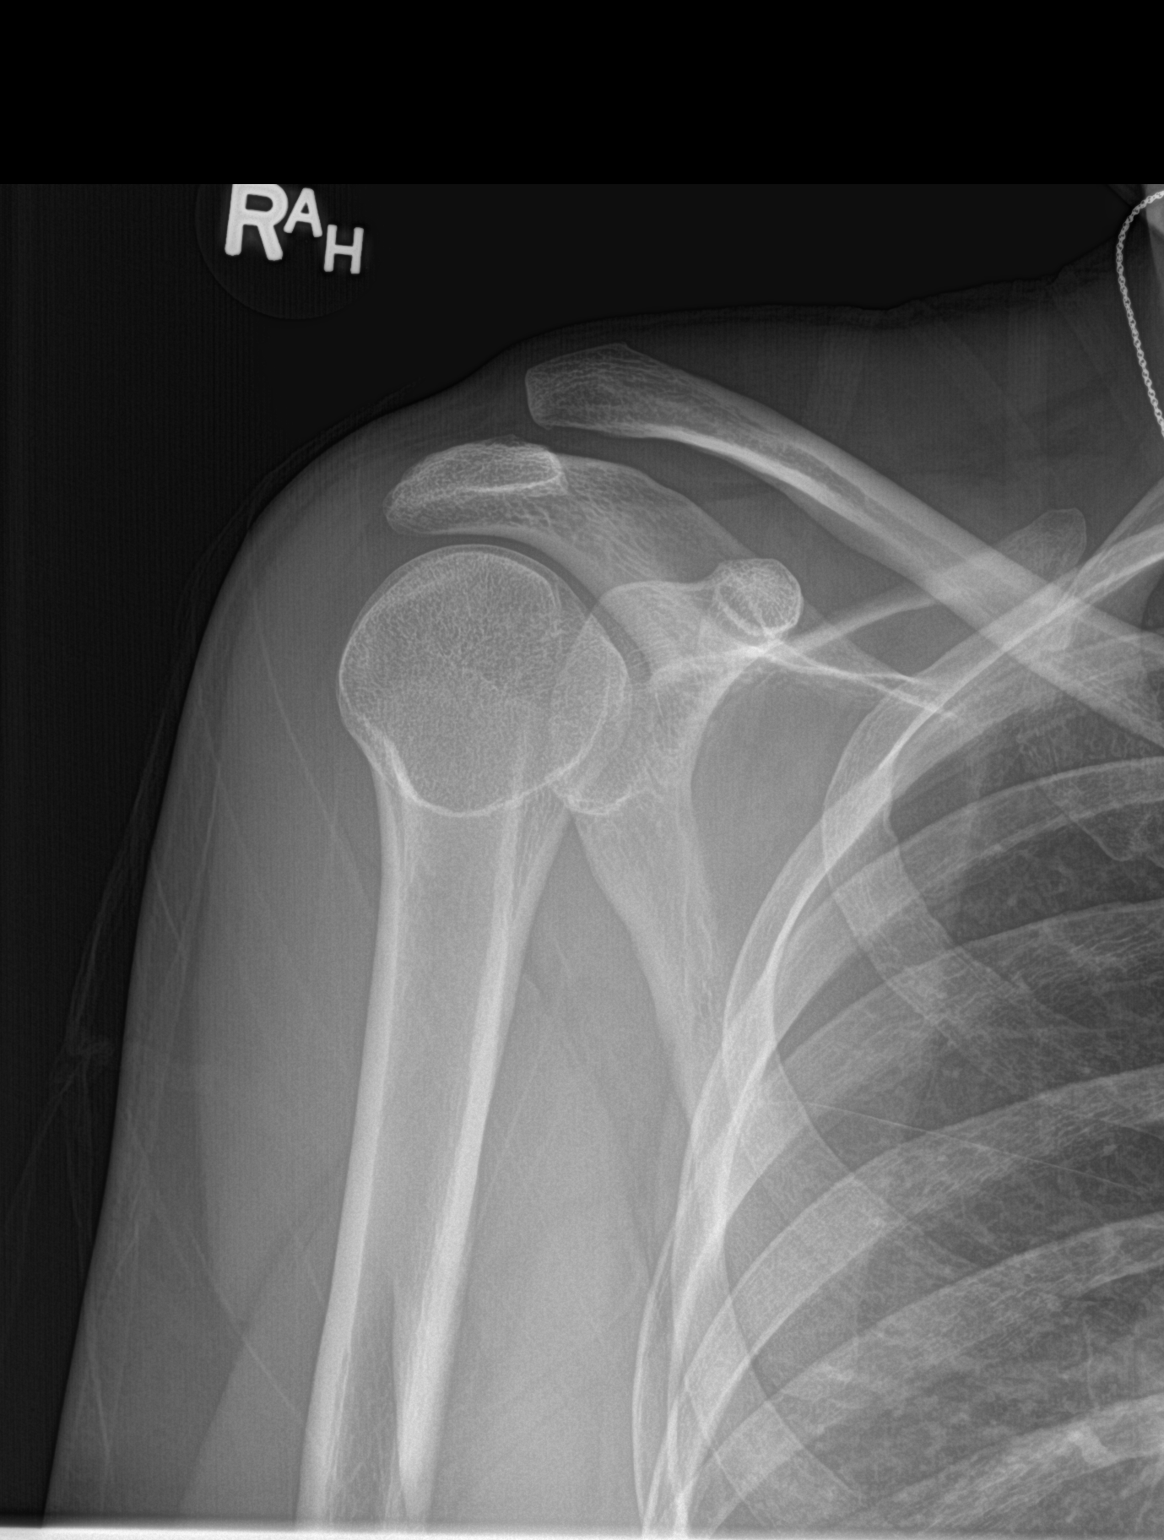

[shoulder y view]
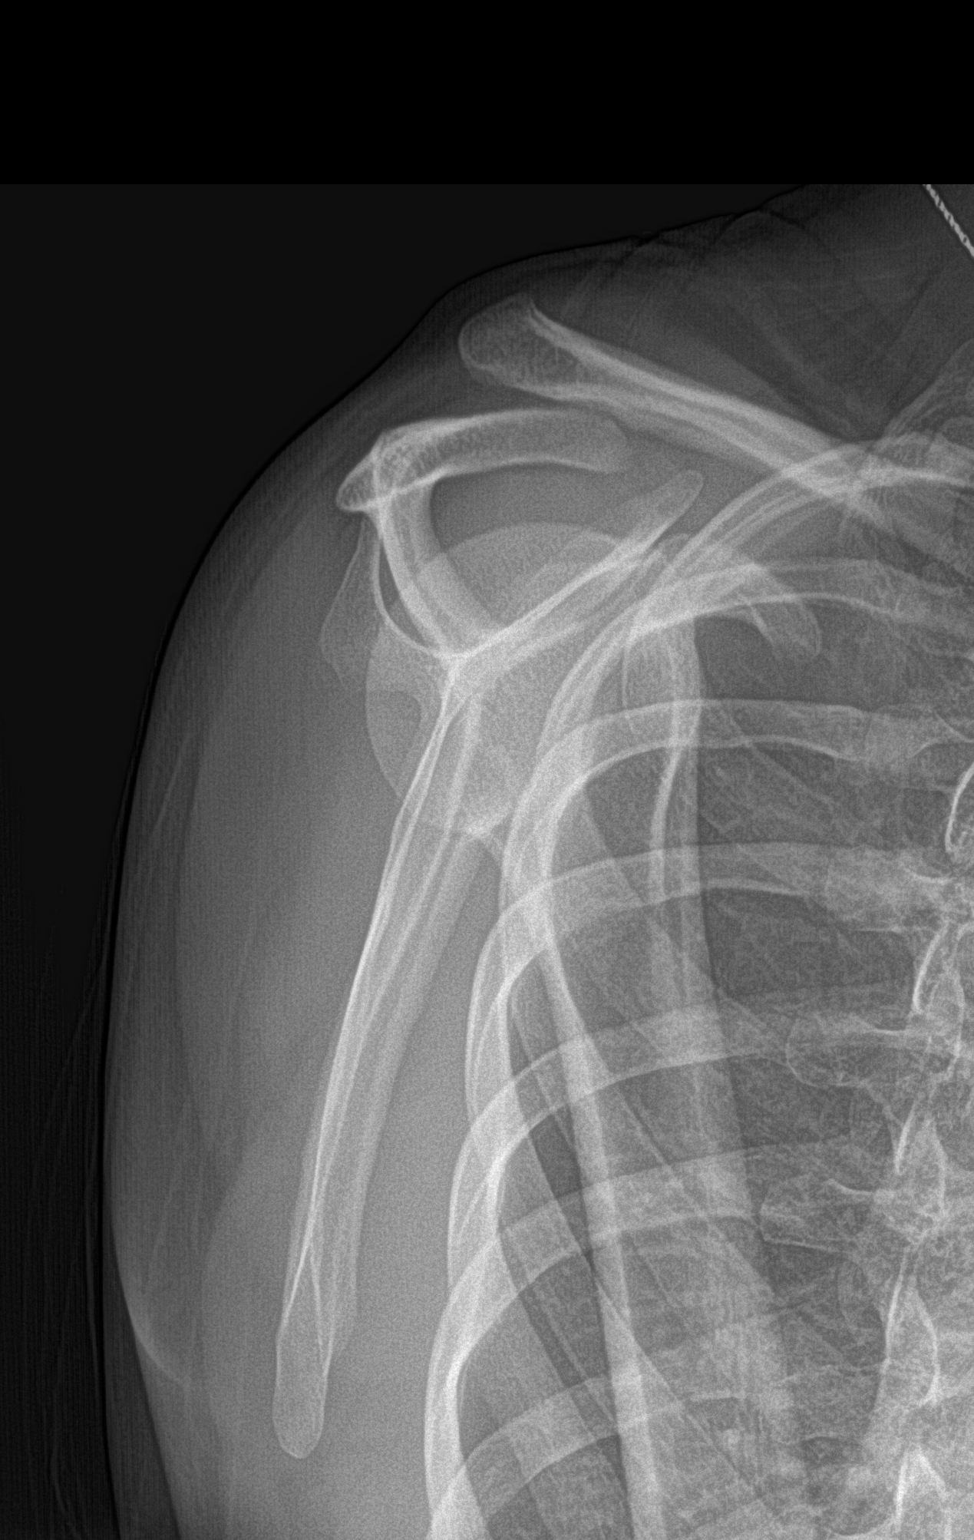

[2 of 2 positions shown; findings below may reference images not displayed]

FINDINGS: There is no evidence of fracture. There appears to be right
acromioclavicular joint separation, with the distal right clavicle
elevated just above the superior border of the acromion, reflecting
a borderline Jerald type III acromioclavicular joint injury.

The right humeral head is seated within the glenoid fossa. No
significant soft tissue abnormalities are seen. The visualized
portions of the right lung are clear.
IMPRESSION: No evidence of fracture. Apparent borderline Jerald type III
acromioclavicular joint injury noted.

## 2019-02-22 ENCOUNTER — Ambulatory Visit: Payer: BC Managed Care – PPO

## 2019-03-06 ENCOUNTER — Ambulatory Visit: Payer: BC Managed Care – PPO | Attending: Internal Medicine

## 2019-03-06 DIAGNOSIS — Z23 Encounter for immunization: Secondary | ICD-10-CM | POA: Insufficient documentation

## 2019-03-06 NOTE — Progress Notes (Signed)
   Covid-19 Vaccination Clinic  Name:  Stephanie Fritz    MRN: [id number] DOB: 07-24-1990  03/06/2019  [name] was observed post Covid-19 immunization for 15 minutes without incidence. She was provided with Vaccine Information Sheet and instruction to access the V-Safe system.   [name] was instructed to call 911 with any severe reactions post vaccine: Marland Kitchen Difficulty breathing  . Swelling of your face and throat  . A fast heartbeat  . A bad rash all over your body  . Dizziness and weakness    Immunizations Administered    Name Date Dose VIS Date Route   Pfizer COVID-19 Vaccine 03/06/2019 12:33 PM 0.3 mL 12/20/2018 Intramuscular   Manufacturer: [employer], [employer]   Lot: [id number]   NDC: [address]-0

## 2019-04-01 ENCOUNTER — Ambulatory Visit: Payer: BC Managed Care – PPO | Attending: Internal Medicine

## 2019-04-01 DIAGNOSIS — Z23 Encounter for immunization: Secondary | ICD-10-CM

## 2019-04-01 NOTE — Progress Notes (Signed)
   Covid-19 Vaccination Clinic  Name:  Stephanie Fritz    MRN: [id number] DOB: Aug 23, 1990  04/01/2019  [name] was observed post Covid-19 immunization for 15 minutes without incident. She was provided with Vaccine Information Sheet and instruction to access the V-Safe system.   [name] was instructed to call 911 with any severe reactions post vaccine: Marland Kitchen Difficulty breathing  . Swelling of face and throat  . A fast heartbeat  . A bad rash all over body  . Dizziness and weakness   Immunizations Administered    Name Date Dose VIS Date Route   Pfizer COVID-19 Vaccine 04/01/2019  4:03 PM 0.3 mL 12/20/2018 Intramuscular   Manufacturer: [employer], [employer]   Lot: [id number]   NDC: [address]-3

## 2020-01-07 DIAGNOSIS — U071 COVID-19: Secondary | ICD-10-CM

## 2020-01-07 HISTORY — DX: COVID-19: U07.1

## 2020-02-16 ENCOUNTER — Other Ambulatory Visit: Payer: Self-pay

## 2020-02-16 ENCOUNTER — Encounter (HOSPITAL_BASED_OUTPATIENT_CLINIC_OR_DEPARTMENT_OTHER): Payer: Self-pay | Admitting: Obstetrics and Gynecology

## 2020-02-16 NOTE — Progress Notes (Addendum)
Spoke w/ via phone for pre-op interview---pt Lab needs dos----none               Lab results------none COVID test ------Positive covid test 01-06-2021 [facility] RESULTS ON CHART Arrive at -------1030 am 02-19-2020 NPO after MN NO Solid Food.  Clear liquids from MN until---930 am then npo Medications to take morning of surgery -----none Diabetic medication -----n/a Patient Special Instructions -----none Pre-Op special Istructions -----none Patient verbalized understanding of instructions that were given at this phone interview. Patient denies shortness of breath, chest pain, fever, cough at this phone interview.

## 2020-02-17 ENCOUNTER — Other Ambulatory Visit: Payer: Self-pay | Admitting: Obstetrics and Gynecology

## 2020-02-17 MED ORDER — DOXYCYCLINE HYCLATE 100 MG IV SOLR
100.0000 mg | Freq: Two times a day (BID) | INTRAVENOUS | Status: AC
  Administered 2020-02-19: 100 mg via INTRAVENOUS

## 2020-02-17 NOTE — H&P (Signed)
30 y.o. G1P0101 with missed ab at 6-7 weeks.  Pt does have some bleeding but not passed any tissue yet.  Past Medical History:  Diagnosis Date  . COVID july 22 -29- 2021  . COVID 01/07/2020   sore throat x 7 days all symptoms resolved  . Missed abortion   . Wears glasses    Past Surgical History:  Procedure Laterality Date  . MOUTH SURGERY  yrs ago  . SHOULDER SURGERY Right 2015    Social History   Socioeconomic History  . Marital status: Married    Spouse name: Not on file  . Number of children: Not on file  . Years of education: Not on file  . Highest education level: Not on file  Occupational History  . Not on file  Tobacco Use  . Smoking status: Never Smoker  . Smokeless tobacco: Never Used  Vaping Use  . Vaping Use: Never used  Substance and Sexual Activity  . Alcohol use: Not Currently  . Drug use: No  . Sexual activity: Yes  Other Topics Concern  . Not on file  Social History Narrative  . Not on file   Social Determinants of Health   Financial Resource Strain: Not on file  Food Insecurity: Not on file  Transportation Needs: Not on file  Physical Activity: Not on file  Stress: Not on file  Social Connections: Not on file  Intimate Partner Violence: Not on file    No current facility-administered medications on file prior to encounter.   Current Outpatient Medications on File Prior to Encounter  Medication Sig Dispense Refill  . acetaminophen (TYLENOL) 500 MG tablet Take 500 mg by mouth every 6 (six) hours as needed.    . folic acid (FOLVITE) 1 MG tablet Take 1 mg by mouth daily.    Marland Kitchen ibuprofen (ADVIL,MOTRIN) 800 MG tablet Take 1 tablet (800 mg total) by mouth every 8 (eight) hours as needed. 60 tablet 3  . Prenatal Vit-Fe Fumarate-FA (PRENATAL MULTIVITAMIN) TABS tablet Take 1 tablet by mouth daily at 12 noon.      No Known Allergies  Vitals:   02/16/20 1543  Weight: 65.8 kg  Height: 5\' 5"  (1.651 m)    Lungs: clear to ascultation Cor:   RRR Abdomen:  soft, nontender, nondistended. Ex:  no cords, erythema Pelvic:  NML EFG, uterus 6-7 weeks size, cervix closed in office.  A:  Missed Ab at 6-7 weeks.   P: P: All risks, benefits and alternatives d/w patient and she desires to proceed.  Patient has undergone a modified diet, ERAS protocol and will receive preop antibiotics and SCDs during the operation.    

## 2020-02-18 ENCOUNTER — Other Ambulatory Visit: Payer: Self-pay | Admitting: Obstetrics and Gynecology

## 2020-02-19 ENCOUNTER — Other Ambulatory Visit: Payer: Self-pay

## 2020-02-19 ENCOUNTER — Ambulatory Visit (HOSPITAL_BASED_OUTPATIENT_CLINIC_OR_DEPARTMENT_OTHER)
Admission: RE | Admit: 2020-02-19 | Discharge: 2020-02-19 | Disposition: A | Discharge status: Home / Self Care | Payer: BC Managed Care – PPO | Attending: Obstetrics and Gynecology | Admitting: Obstetrics and Gynecology

## 2020-02-19 ENCOUNTER — Encounter (HOSPITAL_BASED_OUTPATIENT_CLINIC_OR_DEPARTMENT_OTHER)
Admission: RE | Disposition: A | Discharge status: Home / Self Care | Payer: Self-pay | Source: Home / Self Care | Attending: Obstetrics and Gynecology

## 2020-02-19 ENCOUNTER — Ambulatory Visit (HOSPITAL_BASED_OUTPATIENT_CLINIC_OR_DEPARTMENT_OTHER): Payer: BC Managed Care – PPO | Admitting: Anesthesiology

## 2020-02-19 ENCOUNTER — Encounter (HOSPITAL_BASED_OUTPATIENT_CLINIC_OR_DEPARTMENT_OTHER): Payer: Self-pay | Admitting: Obstetrics and Gynecology

## 2020-02-19 DIAGNOSIS — O021 Missed abortion: Secondary | ICD-10-CM | POA: Insufficient documentation

## 2020-02-19 DIAGNOSIS — Z8616 Personal history of COVID-19: Secondary | ICD-10-CM | POA: Diagnosis not present

## 2020-02-19 HISTORY — DX: Presence of spectacles and contact lenses: Z97.3

## 2020-02-19 HISTORY — PX: DILATION AND EVACUATION: SHX1459

## 2020-02-19 HISTORY — DX: Missed abortion: O02.1

## 2020-02-19 LAB — CBC
HCT: 37.5 % (ref 36.0–46.0)
Hemoglobin: 12.5 g/dL (ref 12.0–15.0)
MCH: 31.2 pg (ref 26.0–34.0)
MCHC: 33.3 g/dL (ref 30.0–36.0)
MCV: 93.5 fL (ref 80.0–100.0)
Platelets: 302 10*3/uL (ref 150–400)
RBC: 4.01 MIL/uL (ref 3.87–5.11)
RDW: 12.4 % (ref 11.5–15.5)
WBC: 9.8 10*3/uL (ref 4.0–10.5)
nRBC: 0 % (ref 0.0–0.2)

## 2020-02-19 SURGERY — DILATION AND EVACUATION, UTERUS
Anesthesia: Monitor Anesthesia Care | Site: Vagina

## 2020-02-19 MED ORDER — DEXMEDETOMIDINE (PRECEDEX) IN NS 20 MCG/5ML (4 MCG/ML) IV SYRINGE
PREFILLED_SYRINGE | INTRAVENOUS | Status: DC | PRN
  Administered 2020-02-19: 12 ug via INTRAVENOUS
  Administered 2020-02-19: 8 ug via INTRAVENOUS

## 2020-02-19 MED ORDER — LIDOCAINE HCL (PF) 2 % IJ SOLN
INTRAMUSCULAR | Status: AC
  Filled 2020-02-19: qty 5

## 2020-02-19 MED ORDER — PROPOFOL 10 MG/ML IV BOLUS
INTRAVENOUS | Status: DC | PRN
  Administered 2020-02-19: 20 mg via INTRAVENOUS

## 2020-02-19 MED ORDER — SOD CITRATE-CITRIC ACID 500-334 MG/5ML PO SOLN: 30.0000 mL | ORAL | Status: DC

## 2020-02-19 MED ORDER — FENTANYL CITRATE (PF) 100 MCG/2ML IJ SOLN
INTRAMUSCULAR | Status: AC
  Filled 2020-02-19: qty 2

## 2020-02-19 MED ORDER — ONDANSETRON HCL 4 MG/2ML IJ SOLN
INTRAMUSCULAR | Status: DC | PRN
  Administered 2020-02-19: 4 mg via INTRAVENOUS

## 2020-02-19 MED ORDER — SODIUM CHLORIDE 0.9 % IV SOLN
100.0000 mg | Freq: Once | INTRAVENOUS | Status: DC
  Filled 2020-02-19: qty 100

## 2020-02-19 MED ORDER — FENTANYL CITRATE (PF) 100 MCG/2ML IJ SOLN
INTRAMUSCULAR | Status: DC | PRN
  Administered 2020-02-19: 100 ug via INTRAVENOUS

## 2020-02-19 MED ORDER — ACETAMINOPHEN 500 MG PO TABS
1000.0000 mg | ORAL_TABLET | Freq: Once | ORAL | Status: AC
  Administered 2020-02-19: 1000 mg via ORAL

## 2020-02-19 MED ORDER — LACTATED RINGERS IV SOLN: INTRAVENOUS | Status: DC

## 2020-02-19 MED ORDER — FENTANYL CITRATE (PF) 100 MCG/2ML IJ SOLN: 25.0000 ug | INTRAMUSCULAR | Status: DC | PRN

## 2020-02-19 MED ORDER — PROPOFOL 500 MG/50ML IV EMUL
INTRAVENOUS | Status: DC | PRN
  Administered 2020-02-19: 150 ug/kg/min via INTRAVENOUS

## 2020-02-19 MED ORDER — METHYLERGONOVINE MALEATE 0.2 MG/ML IJ SOLN
INTRAMUSCULAR | Status: AC
  Filled 2020-02-19: qty 1

## 2020-02-19 MED ORDER — LIDOCAINE HCL (CARDIAC) PF 100 MG/5ML IV SOSY
PREFILLED_SYRINGE | INTRAVENOUS | Status: DC | PRN
  Administered 2020-02-19: 60 mg via INTRAVENOUS

## 2020-02-19 MED ORDER — ACETAMINOPHEN 500 MG PO TABS
ORAL_TABLET | ORAL | Status: AC
  Filled 2020-02-19: qty 2

## 2020-02-19 MED ORDER — MIDAZOLAM HCL 2 MG/2ML IJ SOLN
INTRAMUSCULAR | Status: AC
  Filled 2020-02-19: qty 2

## 2020-02-19 MED ORDER — METHYLERGONOVINE MALEATE 0.2 MG/ML IJ SOLN
INTRAMUSCULAR | Status: DC | PRN
  Administered 2020-02-19: .2 mg via INTRAMUSCULAR

## 2020-02-19 MED ORDER — ONDANSETRON HCL 4 MG/2ML IJ SOLN
INTRAMUSCULAR | Status: AC
  Filled 2020-02-19: qty 2

## 2020-02-19 MED ORDER — PROMETHAZINE HCL 25 MG/ML IJ SOLN
6.2500 mg | INTRAMUSCULAR | Status: DC | PRN
Start: 2020-02-19 — End: 2020-02-19

## 2020-02-19 MED ORDER — POVIDONE-IODINE 10 % EX SWAB: 2.0000 "application " | Freq: Once | CUTANEOUS | Status: DC

## 2020-02-19 MED ORDER — MIDAZOLAM HCL 5 MG/5ML IJ SOLN
INTRAMUSCULAR | Status: DC | PRN
  Administered 2020-02-19: 2 mg via INTRAVENOUS

## 2020-02-19 MED ORDER — PROPOFOL 10 MG/ML IV BOLUS
INTRAVENOUS | Status: AC
  Filled 2020-02-19: qty 20

## 2020-02-19 SURGICAL SUPPLY — 19 items
CATH ROBINSON RED A/P 16FR (CATHETERS) ×3 IMPLANT
COVER WAND RF STERILE (DRAPES) ×3 IMPLANT
DECANTER SPIKE VIAL GLASS SM (MISCELLANEOUS) ×3 IMPLANT
FILTER UTR ASPR ASSEMBLY (MISCELLANEOUS) ×3 IMPLANT
GLOVE SURG ENC MOIS LTX SZ6.5 (GLOVE) ×6 IMPLANT
GLOVE SURG ENC MOIS LTX SZ7 (GLOVE) ×3 IMPLANT
GLOVE SURG UNDER POLY LF SZ7 (GLOVE) ×9 IMPLANT
GOWN STRL REUS W/TWL LRG LVL3 (GOWN DISPOSABLE) ×9 IMPLANT
HOSE CONNECTING 18IN BERKELEY (TUBING) ×3 IMPLANT
KIT BERKELEY 1ST TRI 3/8 NO TR (MISCELLANEOUS) IMPLANT
KIT BERKELEY 1ST TRIMESTER 3/8 (MISCELLANEOUS) ×6 IMPLANT
KIT TURNOVER CYSTO (KITS) ×3 IMPLANT
NS IRRIG 1000ML POUR BTL (IV SOLUTION) ×3 IMPLANT
PACK VAGINAL MINOR WOMEN LF (CUSTOM PROCEDURE TRAY) ×3 IMPLANT
PAD OB MATERNITY 4.3X12.25 (PERSONAL CARE ITEMS) ×3 IMPLANT
PAD PREP 24X48 CUFFED NSTRL (MISCELLANEOUS) ×3 IMPLANT
SET BERKELEY SUCTION TUBING (SUCTIONS) ×3 IMPLANT
TOWEL OR 17X26 10 PK STRL BLUE (TOWEL DISPOSABLE) ×3 IMPLANT
VACURETTE 8 RIGID CVD (CANNULA) ×3 IMPLANT

## 2020-02-19 NOTE — Op Note (Signed)
02/19/2020  12:34 PM  PATIENT:  Stephanie Fritz  30 y.o. female  PRE-OPERATIVE DIAGNOSIS:  MISSED AB  POST-OPERATIVE DIAGNOSIS:  MISSED AB  PROCEDURE:  Procedure(s): DILATATION AND EVACUATION (N/A)  SURGEON:  Surgeon(s) and Role:    * Bobbye Charleston, MD - Primary   ANESTHESIA:   MAC  EBL:  10 mL   LOCAL MEDICATIONS USED:  NONE  SPECIMEN:  Source of Specimen:  uterine contents  DISPOSITION OF SPECIMEN:  PATHOLOGY  COUNTS:  YES  TOURNIQUET:  * No tourniquets in log *  DICTATION: .Note written in EPIC  PLAN OF CARE: Admit to inpatient   PATIENT DISPOSITION:  PACU - hemodynamically stable.   Delay start of Pharmacological VTE agent (>24hrs) due to surgical blood loss or risk of bleeding: not applicable  Medications: Methergine  Complications: None  Findings:  7 week size uterus to 6 size post procedure.  Good crie was achieved.  After adequate anesthesia was achieved, the patient was prepped and draped in the usual sterile fashion.  The speculum was placed in the vagina and the cervix stabilized with a single-tooth tenaculum.  The cervix was dilated with Kennon Rounds dilators and the 8 mm curette was used to remove contents of the uterus.  Alternating sharp curettage with a curette and suction curettage was performed until all contents were removed and good crie was achieved.  All instruments were removed from the vagina.  The patient tolerated the procedure well.    Christorpher Hisaw A

## 2020-02-19 NOTE — Anesthesia Preprocedure Evaluation (Signed)
Anesthesia Evaluation  Patient identified by MRN, date of birth, ID band Patient awake    Reviewed: Allergy & Precautions, NPO status , Patient's Chart, lab work & pertinent test results  Airway Mallampati: I  TM Distance: >3 FB Neck ROM: Full    Dental  (+) Teeth Intact, Dental Advisory Given, Implants,    Pulmonary  Covid 7/21 and 12/21   Pulmonary exam normal breath sounds clear to auscultation       Cardiovascular negative cardio ROS Normal cardiovascular exam Rhythm:Regular Rate:Normal     Neuro/Psych negative neurological ROS  negative psych ROS   GI/Hepatic negative GI ROS, Neg liver ROS,   Endo/Other  negative endocrine ROS  Renal/GU negative Renal ROS     Musculoskeletal negative musculoskeletal ROS (+)   Abdominal   Peds  Hematology negative hematology ROS (+)   Anesthesia Other Findings Day of surgery medications reviewed with the patient.  Reproductive/Obstetrics (+) Pregnancy (7 wks missed abortion)                             Anesthesia Physical Anesthesia Plan  ASA: II  Anesthesia Plan: MAC   Post-op Pain Management:    Induction: Intravenous  PONV Risk Score and Plan: 2 and Propofol infusion, Treatment may vary due to age or medical condition and Midazolam  Airway Management Planned: Mask and Natural Airway  Additional Equipment:   Intra-op Plan:   Post-operative Plan:   Informed Consent: I have reviewed the patients History and Physical, chart, labs and discussed the procedure including the risks, benefits and alternatives for the proposed anesthesia with the patient or authorized representative who has indicated his/her understanding and acceptance.     Dental advisory given  Plan Discussed with: CRNA  Anesthesia Plan Comments:         Anesthesia Quick Evaluation

## 2020-02-19 NOTE — Discharge Instructions (Signed)
DISCHARGE INSTRUCTIONS: D&C / D&E The following instructions have been prepared to help you care for yourself upon your return home.   Personal hygiene: Marland Kitchen Use sanitary pads for vaginal drainage, not tampons. . Shower the day after your procedure. . NO tub baths, pools or Jacuzzis for 2-3 weeks. . Wipe front to back after using the bathroom.  Activity and limitations: . Do NOT drive or operate any equipment for 24 hours. The effects of anesthesia are still present and drowsiness may result. . Do NOT rest in bed all day. . Walking is encouraged. . Walk up and down stairs slowly. . You may resume your normal activity in one to two days or as indicated by your physician.  Sexual activity: NO intercourse for at least 2 weeks after the procedure, or as indicated by your physician.  Diet: Eat a light meal as desired this evening. You may resume your usual diet tomorrow.  Return to work: You may resume your work activities in one to two days or as indicated by your doctor.  What to expect after your surgery: Expect to have vaginal bleeding/discharge for 2-3 days and spotting for up to 10 days. It is not unusual to have soreness for up to 1-2 weeks. You may have a slight burning sensation when you urinate for the first day. Mild cramps may continue for a couple of days. You may have a regular period in 2-6 weeks.  Call your doctor for any of the following: . Excessive vaginal bleeding, saturating and changing one pad every hour. . Inability to urinate 6 hours after discharge from hospital. . Pain not relieved by pain medication. . Fever of 100.4 F or greater. . Unusual vaginal discharge or odor.   Post Anesthesia Home Care Instructions  Activity: Get plenty of rest for the remainder of the day. A responsible individual must stay with you for 24 hours following the procedure.  For the next 24 hours, DO NOT: -Drive a car -Advertising copywriter -Drink alcoholic beverages -Take any medication  unless instructed by your physician -Make any legal decisions or sign important papers.  Meals: Start with liquid foods such as gelatin or soup. Progress to regular foods as tolerated. Avoid greasy, spicy, heavy foods. If nausea and/or vomiting occur, drink only clear liquids until the nausea and/or vomiting subsides. Call your physician if vomiting continues.  Special Instructions/Symptoms: Your throat may feel dry or sore from the anesthesia or the breathing tube placed in your throat during surgery. If this causes discomfort, gargle with warm salt water. The discomfort should disappear within 24 hours.  No additional Tylenol/acetaminophen products until after 5:00 pm today if needed.

## 2020-02-19 NOTE — Brief Op Note (Signed)
02/19/2020  12:34 PM  PATIENT:  Stephanie Fritz  30 y.o. female  PRE-OPERATIVE DIAGNOSIS:  MISSED AB  POST-OPERATIVE DIAGNOSIS:  MISSED AB  PROCEDURE:  Procedure(s): DILATATION AND EVACUATION (N/A)  SURGEON:  Surgeon(s) and Role:    * Bobbye Charleston, MD - Primary   ANESTHESIA:   MAC  EBL:  10 mL   LOCAL MEDICATIONS USED:  NONE  SPECIMEN:  Source of Specimen:  uterine contents  DISPOSITION OF SPECIMEN:  PATHOLOGY  COUNTS:  YES  TOURNIQUET:  * No tourniquets in log *  DICTATION: .Note written in EPIC  PLAN OF CARE: Admit to inpatient   PATIENT DISPOSITION:  PACU - hemodynamically stable.   Delay start of Pharmacological VTE agent (>24hrs) due to surgical blood loss or risk of bleeding: not applicable

## 2020-02-19 NOTE — Anesthesia Postprocedure Evaluation (Signed)
Anesthesia Post Note  Patient: Stephanie Fritz  Procedure(s) Performed: DILATATION AND EVACUATION (N/A Vagina )     Patient location during evaluation: PACU Anesthesia Type: MAC Level of consciousness: awake and alert Pain management: pain level controlled Vital Signs Assessment: post-procedure vital signs reviewed and stable Respiratory status: spontaneous breathing, nonlabored ventilation, respiratory function stable and patient connected to nasal cannula oxygen Cardiovascular status: stable and blood pressure returned to baseline Postop Assessment: no apparent nausea or vomiting Anesthetic complications: no   No complications documented.  Last Vitals:  Vitals:   02/19/20 1315 02/19/20 1343  BP: 90/71 108/65  Pulse: (!) 48 96  Resp: 15 12  Temp: 36.6 C 36.4 C  SpO2: 99% 100%    Last Pain:  Vitals:   02/19/20 1241  TempSrc:   PainSc: Asleep                 Catalina Gravel

## 2020-02-19 NOTE — Progress Notes (Signed)
There has been no change in the patients history, status or exam since the history and physical.  Vitals:   02/16/20 1543 02/19/20 1108  BP:  99/64  Pulse:  71  Resp:  14  Temp:  98.7 F (37.1 C)  TempSrc:  Oral  SpO2:  100%  Weight: 65.8 kg 65.3 kg  Height: 5\' 5"  (1.651 m) 5\' 5"  (1.651 m)    Results for orders placed or performed during the hospital encounter of 02/19/20 (from the past 72 hour(s))  CBC     Status: None   Collection Time: 02/19/20 11:14 AM  Result Value Ref Range   WBC 9.8 4.0 - 10.5 K/uL   RBC 4.01 3.87 - 5.11 MIL/uL   Hemoglobin 12.5 12.0 - 15.0 g/dL   HCT 04/18/20 04/18/20 - 76.2 %   MCV 93.5 80.0 - 100.0 fL   MCH 31.2 26.0 - 34.0 pg   MCHC 33.3 30.0 - 36.0 g/dL   RDW 83.1 51.7 - 61.6 %   Platelets 302 150 - 400 K/uL   nRBC 0.0 0.0 - 0.2 %    Comment: Performed at [facility], [address], [place], [place]    [place]

## 2020-02-19 NOTE — Transfer of Care (Signed)
Immediate Anesthesia Transfer of Care Note  Patient: Stephanie Fritz  Procedure(s) Performed: DILATATION AND EVACUATION (N/A Vagina )  Patient Location: PACU  Anesthesia Type:MAC  Level of Consciousness: drowsy  Airway & Oxygen Therapy: Patient Spontanous Breathing and Patient connected to face mask oxygen  Post-op Assessment: Report given to RN and Post -op Vital signs reviewed and stable  Post vital signs: Reviewed and stable  Last Vitals:  Vitals Value Taken Time  BP 99/50 02/19/20 1241  Temp    Pulse 56 02/19/20 1243  Resp 13 02/19/20 1243  SpO2 100 % 02/19/20 1243  Vitals shown include unvalidated device data.  Last Pain:  Vitals:   02/19/20 1108  TempSrc: Oral  PainSc: 0-No pain      Patients Stated Pain Goal: 6 (95/32/02 3343)  Complications: No complications documented.

## 2020-02-20 ENCOUNTER — Encounter (HOSPITAL_BASED_OUTPATIENT_CLINIC_OR_DEPARTMENT_OTHER): Payer: Self-pay | Admitting: Obstetrics and Gynecology

## 2020-02-24 LAB — SURGICAL PATHOLOGY

## 2022-12-26 ENCOUNTER — Other Ambulatory Visit: Payer: Self-pay | Admitting: Obstetrics and Gynecology

## 2023-04-02 ENCOUNTER — Other Ambulatory Visit: Payer: Self-pay

## 2023-04-02 ENCOUNTER — Encounter (HOSPITAL_COMMUNITY): Payer: Self-pay | Admitting: Obstetrics and Gynecology

## 2023-04-02 NOTE — Anesthesia Preprocedure Evaluation (Signed)
 Anesthesia Evaluation  Patient identified by MRN, date of birth, ID band Patient awake    Reviewed: Allergy & Precautions, NPO status , Patient's Chart, lab work & pertinent test results  Airway Mallampati: II  TM Distance: >3 FB     Dental no notable dental hx. (+) Implants, Caps, Dental Advisory Given,    Pulmonary neg pulmonary ROS   Pulmonary exam normal breath sounds clear to auscultation       Cardiovascular negative cardio ROS Normal cardiovascular exam Rhythm:Regular Rate:Normal     Neuro/Psych negative neurological ROS  negative psych ROS   GI/Hepatic negative GI ROS, Neg liver ROS,,,  Endo/Other  negative endocrine ROS    Renal/GU negative Renal ROS  negative genitourinary   Musculoskeletal negative musculoskeletal ROS (+)    Abdominal   Peds  Hematology negative hematology ROS (+)   Anesthesia Other Findings   Reproductive/Obstetrics negative OB ROS Left Hydrosalphinx                             Anesthesia Physical Anesthesia Plan  ASA: 1  Anesthesia Plan: General   Post-op Pain Management: Tylenol PO (pre-op)* and Toradol IV (intra-op)*   Induction: Intravenous  PONV Risk Score and Plan: 4 or greater and Ondansetron, Dexamethasone, Midazolam, Scopolamine patch - Pre-op and Treatment may vary due to age or medical condition  Airway Management Planned: Oral ETT  Additional Equipment: None  Intra-op Plan:   Post-operative Plan: Extubation in OR  Informed Consent: I have reviewed the patients History and Physical, chart, labs and discussed the procedure including the risks, benefits and alternatives for the proposed anesthesia with the patient or authorized representative who has indicated his/her understanding and acceptance.     Dental advisory given  Plan Discussed with: Anesthesiologist and CRNA  Anesthesia Plan Comments:        Anesthesia Quick  Evaluation

## 2023-04-02 NOTE — Progress Notes (Signed)
 SDW CALL  Patient was given pre-op instructions over the phone. The opportunity was given for the patient to ask questions. No further questions asked. Patient verbalized understanding of instructions given.   PCP - patient does not have a PCP at this time Cardiologist - denies  PPM/ICD - denies   Chest x-ray - denies EKG - denies Stress Test - denies ECHO - denies Cardiac Cath - denies  Sleep Study - denies  No DM  Last dose of GLP1 agonist-  n/a GLP1 instructions: n/a  Blood Thinner Instructions: n/a Aspirin Instructions: n/a  ERAS Protcol - pt was instructed by office to start a clear liquid diet on 3/25 at 1200 and instructed to drink mag citrate.  Patient understand to follow the instructions given by the surgeon's office.   COVID TEST- n/a   Anesthesia review: no  Patient denies shortness of breath, fever, cough and chest pain over the phone call   All instructions explained to the patient, with a verbal understanding of the material. Patient agrees to go over the instructions while at home for a better understanding.    Teach back method used to review arrival time and date, medications, and bowel prep instructions.

## 2023-04-04 ENCOUNTER — Other Ambulatory Visit: Payer: Self-pay

## 2023-04-04 ENCOUNTER — Ambulatory Visit (HOSPITAL_COMMUNITY): Payer: Self-pay | Admitting: Anesthesiology

## 2023-04-04 ENCOUNTER — Ambulatory Visit (HOSPITAL_BASED_OUTPATIENT_CLINIC_OR_DEPARTMENT_OTHER): Payer: Self-pay | Admitting: Anesthesiology

## 2023-04-04 ENCOUNTER — Ambulatory Visit (HOSPITAL_COMMUNITY)
Admission: RE | Admit: 2023-04-04 | Discharge: 2023-04-04 | Disposition: A | Discharge status: Home / Self Care | Payer: 59 | Attending: Obstetrics and Gynecology | Admitting: Obstetrics and Gynecology

## 2023-04-04 ENCOUNTER — Encounter (HOSPITAL_COMMUNITY)
Admission: RE | Disposition: A | Discharge status: Home / Self Care | Payer: Self-pay | Source: Home / Self Care | Attending: Obstetrics and Gynecology

## 2023-04-04 ENCOUNTER — Encounter (HOSPITAL_COMMUNITY): Payer: Self-pay | Admitting: Obstetrics and Gynecology

## 2023-04-04 DIAGNOSIS — N803 Endometriosis of pelvic peritoneum, unspecified: Secondary | ICD-10-CM | POA: Insufficient documentation

## 2023-04-04 DIAGNOSIS — N809 Endometriosis, unspecified: Secondary | ICD-10-CM

## 2023-04-04 DIAGNOSIS — N946 Dysmenorrhea, unspecified: Secondary | ICD-10-CM | POA: Diagnosis present

## 2023-04-04 DIAGNOSIS — Z01818 Encounter for other preprocedural examination: Secondary | ICD-10-CM

## 2023-04-04 DIAGNOSIS — N971 Female infertility of tubal origin: Secondary | ICD-10-CM | POA: Diagnosis not present

## 2023-04-04 HISTORY — PX: LAPAROSCOPIC LYSIS OF ADHESIONS: SHX5905

## 2023-04-04 LAB — CBC
HCT: 38.8 % (ref 36.0–46.0)
Hemoglobin: 13.7 g/dL (ref 12.0–15.0)
MCH: 31.1 pg (ref 26.0–34.0)
MCHC: 35.3 g/dL (ref 30.0–36.0)
MCV: 88 fL (ref 80.0–100.0)
Platelets: 335 10*3/uL (ref 150–400)
RBC: 4.41 MIL/uL (ref 3.87–5.11)
RDW: 12.2 % (ref 11.5–15.5)
WBC: 8.6 10*3/uL (ref 4.0–10.5)
nRBC: 0 % (ref 0.0–0.2)

## 2023-04-04 LAB — TYPE AND SCREEN
ABO/RH(D): AB POS
Antibody Screen: NEGATIVE

## 2023-04-04 LAB — POCT PREGNANCY, URINE: Preg Test, Ur: NEGATIVE

## 2023-04-04 SURGERY — EXCISION, ENDOMETRIOSIS, LAPAROSCOPIC
Anesthesia: General | Site: Abdomen

## 2023-04-04 MED ORDER — ONDANSETRON HCL 4 MG/2ML IJ SOLN
INTRAMUSCULAR | Status: DC | PRN
  Administered 2023-04-04: 4 mg via INTRAVENOUS

## 2023-04-04 MED ORDER — POVIDONE-IODINE 10 % EX SWAB
2.0000 | Freq: Once | CUTANEOUS | Status: AC
  Administered 2023-04-04: 2 via TOPICAL

## 2023-04-04 MED ORDER — LIDOCAINE 2% (20 MG/ML) 5 ML SYRINGE
INTRAMUSCULAR | Status: DC | PRN
  Administered 2023-04-04: 60 mg via INTRAVENOUS

## 2023-04-04 MED ORDER — ACETAMINOPHEN 500 MG PO TABS
ORAL_TABLET | ORAL | Status: AC
  Administered 2023-04-04: 1000 mg via ORAL
  Filled 2023-04-04: qty 2

## 2023-04-04 MED ORDER — FENTANYL CITRATE (PF) 250 MCG/5ML IJ SOLN
INTRAMUSCULAR | Status: AC
  Filled 2023-04-04: qty 5

## 2023-04-04 MED ORDER — MEPERIDINE HCL 25 MG/ML IJ SOLN: 6.2500 mg | INTRAMUSCULAR | Status: DC | PRN

## 2023-04-04 MED ORDER — SCOPOLAMINE 1 MG/3DAYS TD PT72: 1.0000 | MEDICATED_PATCH | TRANSDERMAL | Status: DC

## 2023-04-04 MED ORDER — ORAL CARE MOUTH RINSE: 15.0000 mL | Freq: Once | OROMUCOSAL | Status: AC

## 2023-04-04 MED ORDER — SCOPOLAMINE 1 MG/3DAYS TD PT72
MEDICATED_PATCH | TRANSDERMAL | Status: AC
  Administered 2023-04-04: 1.5 mg via TRANSDERMAL
  Filled 2023-04-04: qty 1

## 2023-04-04 MED ORDER — FENTANYL CITRATE (PF) 250 MCG/5ML IJ SOLN
INTRAMUSCULAR | Status: DC | PRN
  Administered 2023-04-04: 50 ug via INTRAVENOUS
  Administered 2023-04-04: 100 ug via INTRAVENOUS

## 2023-04-04 MED ORDER — MIDAZOLAM HCL 2 MG/2ML IJ SOLN
INTRAMUSCULAR | Status: AC
  Filled 2023-04-04: qty 2

## 2023-04-04 MED ORDER — HYDROMORPHONE HCL 1 MG/ML IJ SOLN: 0.2500 mg | INTRAMUSCULAR | Status: DC | PRN

## 2023-04-04 MED ORDER — METHYLENE BLUE (ANTIDOTE) 1 % IV SOLN
INTRAVENOUS | Status: AC
  Filled 2023-04-04: qty 10

## 2023-04-04 MED ORDER — DEXAMETHASONE SODIUM PHOSPHATE 10 MG/ML IJ SOLN
INTRAMUSCULAR | Status: DC | PRN
  Administered 2023-04-04: 10 mg via INTRAVENOUS

## 2023-04-04 MED ORDER — LACTATED RINGERS IV SOLN: INTRAVENOUS | Status: DC

## 2023-04-04 MED ORDER — CEFAZOLIN SODIUM-DEXTROSE 2-3 GM-%(50ML) IV SOLR
INTRAVENOUS | Status: DC | PRN
  Administered 2023-04-04: 2 g via INTRAVENOUS

## 2023-04-04 MED ORDER — BUPIVACAINE-EPINEPHRINE 0.25% -1:200000 IJ SOLN
INTRAMUSCULAR | Status: DC | PRN
  Administered 2023-04-04: 7 mL

## 2023-04-04 MED ORDER — CHLORHEXIDINE GLUCONATE 0.12 % MT SOLN
OROMUCOSAL | Status: AC
  Administered 2023-04-04: 15 mL via OROMUCOSAL
  Filled 2023-04-04: qty 15

## 2023-04-04 MED ORDER — KETOROLAC TROMETHAMINE 30 MG/ML IJ SOLN: 30.0000 mg | Freq: Once | INTRAMUSCULAR | Status: DC | PRN

## 2023-04-04 MED ORDER — KETOROLAC TROMETHAMINE 30 MG/ML IJ SOLN
INTRAMUSCULAR | Status: AC
  Filled 2023-04-04: qty 1

## 2023-04-04 MED ORDER — AMISULPRIDE (ANTIEMETIC) 5 MG/2ML IV SOLN: 10.0000 mg | Freq: Once | INTRAVENOUS | Status: DC | PRN

## 2023-04-04 MED ORDER — PROPOFOL 10 MG/ML IV BOLUS
INTRAVENOUS | Status: DC | PRN
  Administered 2023-04-04: 150 mg via INTRAVENOUS

## 2023-04-04 MED ORDER — DEXMEDETOMIDINE HCL IN NACL 80 MCG/20ML IV SOLN
INTRAVENOUS | Status: DC | PRN
  Administered 2023-04-04: 8 ug via INTRAVENOUS

## 2023-04-04 MED ORDER — DEXAMETHASONE SODIUM PHOSPHATE 10 MG/ML IJ SOLN
INTRAMUSCULAR | Status: AC
  Filled 2023-04-04: qty 2

## 2023-04-04 MED ORDER — ACETAMINOPHEN 500 MG PO TABS: 1000.0000 mg | ORAL_TABLET | Freq: Once | ORAL | Status: AC

## 2023-04-04 MED ORDER — OXYCODONE HCL 5 MG PO TABS: 5.0000 mg | ORAL_TABLET | Freq: Once | ORAL | Status: DC | PRN

## 2023-04-04 MED ORDER — SUGAMMADEX SODIUM 200 MG/2ML IV SOLN
INTRAVENOUS | Status: DC | PRN
  Administered 2023-04-04: 100 mg via INTRAVENOUS
  Administered 2023-04-04: 300 mg via INTRAVENOUS

## 2023-04-04 MED ORDER — ROCURONIUM BROMIDE 10 MG/ML (PF) SYRINGE
PREFILLED_SYRINGE | INTRAVENOUS | Status: DC | PRN
  Administered 2023-04-04: 70 mg via INTRAVENOUS

## 2023-04-04 MED ORDER — EPHEDRINE SULFATE (PRESSORS) 50 MG/ML IJ SOLN
INTRAMUSCULAR | Status: DC | PRN
  Administered 2023-04-04: 5 mg via INTRAVENOUS

## 2023-04-04 MED ORDER — CHLORHEXIDINE GLUCONATE 0.12 % MT SOLN: 15.0000 mL | Freq: Once | OROMUCOSAL | Status: AC

## 2023-04-04 MED ORDER — MIDAZOLAM HCL 2 MG/2ML IJ SOLN
INTRAMUSCULAR | Status: DC | PRN
  Administered 2023-04-04: 2 mg via INTRAVENOUS

## 2023-04-04 MED ORDER — ONDANSETRON HCL 4 MG/2ML IJ SOLN
INTRAMUSCULAR | Status: AC
  Filled 2023-04-04: qty 4

## 2023-04-04 MED ORDER — ONDANSETRON HCL 4 MG/2ML IJ SOLN: 4.0000 mg | Freq: Once | INTRAMUSCULAR | Status: DC | PRN

## 2023-04-04 MED ORDER — OXYCODONE HCL 5 MG/5ML PO SOLN: 5.0000 mg | Freq: Once | ORAL | Status: DC | PRN

## 2023-04-04 MED ORDER — PROPOFOL 10 MG/ML IV BOLUS
INTRAVENOUS | Status: AC
  Filled 2023-04-04: qty 20

## 2023-04-04 MED ORDER — BUPIVACAINE-EPINEPHRINE (PF) 0.25% -1:200000 IJ SOLN
INTRAMUSCULAR | Status: AC
  Filled 2023-04-04: qty 30

## 2023-04-04 SURGICAL SUPPLY — 40 items
CABLE HIGH FREQUENCY MONO STRZ (ELECTRODE) IMPLANT
CATH ROBINSON RED A/P 16FR (CATHETERS) IMPLANT
CURETTE PIPELLE ENDOMTRL SUCTN (MISCELLANEOUS) ×1 IMPLANT
DEPRESSOR TONGUE 6 IN STERILE (GAUZE/BANDAGES/DRESSINGS) IMPLANT
DERMABOND ADVANCED .7 DNX12 (GAUZE/BANDAGES/DRESSINGS) ×1 IMPLANT
DRAPE SURG IRRIG POUCH 19X23 (DRAPES) ×3 IMPLANT
DRSG OPSITE POSTOP 3X4 (GAUZE/BANDAGES/DRESSINGS) IMPLANT
ELECT NDL TIP 2.8 STRL (NEEDLE) IMPLANT
ELECT NEEDLE TIP 2.8 STRL (NEEDLE) IMPLANT
ELECT REM PT RETURN 9FT ADLT (ELECTROSURGICAL) ×3 IMPLANT
ELECTRODE REM PT RTRN 9FT ADLT (ELECTROSURGICAL) ×3 IMPLANT
FILTER SMOKE EVAC LAPAROSHD (FILTER) ×1 IMPLANT
GLOVE BIO SURGEON STRL SZ8 (GLOVE) ×6 IMPLANT
GLOVE BIOGEL PI IND STRL 7.0 (GLOVE) ×6 IMPLANT
GLOVE BIOGEL PI IND STRL 8.5 (GLOVE) ×3 IMPLANT
GOWN STRL REUS W/ TWL LRG LVL3 (GOWN DISPOSABLE) ×9 IMPLANT
IRRIG SUCT STRYKERFLOW 2 WTIP (MISCELLANEOUS) ×3 IMPLANT
IRRIGATION SUCT STRKRFLW 2 WTP (MISCELLANEOUS) ×1 IMPLANT
KIT PINK PAD W/HEAD ARE REST (MISCELLANEOUS) ×3 IMPLANT
KIT PINK PAD W/HEAD ARM REST (MISCELLANEOUS) ×3 IMPLANT
KIT TURNOVER KIT B (KITS) ×3 IMPLANT
MANIPULATOR UTERINE 4.5 ZUMI (MISCELLANEOUS) ×3 IMPLANT
NDL INSUFFLATION 14GA 120MM (NEEDLE) ×2 IMPLANT
NEEDLE INSUFFLATION 14GA 120MM (NEEDLE) IMPLANT
PACK LAPAROSCOPY BASIN (CUSTOM PROCEDURE TRAY) ×3 IMPLANT
PENCIL BUTTON HOLSTER BLD 10FT (ELECTRODE) IMPLANT
PIPELLE ENDOMETRIAL SUCTION CU (MISCELLANEOUS) ×3 IMPLANT
SEPRAFILM MEMBRANE 5X6 (MISCELLANEOUS) IMPLANT
SET TUBE SMOKE EVAC HIGH FLOW (TUBING) ×3 IMPLANT
SHEARS HARMONIC ACE PLUS 36CM (ENDOMECHANICALS) ×1 IMPLANT
SLEEVE Z-THREAD 5X100MM (TROCAR) ×4 IMPLANT
SUT MNCRL AB 4-0 PS2 18 (SUTURE) ×3 IMPLANT
SYR 50ML LL SCALE MARK (SYRINGE) ×1 IMPLANT
SYR TOOMEY 50ML (SYRINGE) IMPLANT
SYS BAG RETRIEVAL 10MM (BASKET) IMPLANT
SYSTEM BAG RETRIEVAL 10MM (BASKET) IMPLANT
TOWEL GREEN STERILE FF (TOWEL DISPOSABLE) ×6 IMPLANT
TRAY FOLEY W/BAG SLVR 14FR (SET/KITS/TRAYS/PACK) IMPLANT
TROCAR OPTI TIP 5M 100M (ENDOMECHANICALS) ×3 IMPLANT
WARMER LAPAROSCOPE (MISCELLANEOUS) ×3 IMPLANT

## 2023-04-04 NOTE — Transfer of Care (Signed)
 Immediate Anesthesia Transfer of Care Note  Patient: Stephanie Fritz  Procedure(s) Performed: LAPAROSCOPIC FULGURATION OF ENDOMETRIOSIS, ENDOMETRIAL BIOPSY (Abdomen) LYSIS, ADHESIONS, LAPAROSCOPIC (Abdomen)  Patient Location: PACU  Anesthesia Type:General  Level of Consciousness: awake and alert   Airway & Oxygen Therapy: Patient Spontanous Breathing and Patient connected to face mask oxygen  Post-op Assessment: Report given to RN and Post -op Vital signs reviewed and stable  Post vital signs: Reviewed and stable  Last Vitals:  Vitals Value Taken Time  BP 120/66 04/04/23 1620  Temp    Pulse 63 04/04/23 1624  Resp 15 04/04/23 1624  SpO2 100 % 04/04/23 1624  Vitals shown include unfiled device data.  Last Pain:  Vitals:   04/04/23 1021  PainSc: 0-No pain         Complications: No notable events documented.

## 2023-04-04 NOTE — Anesthesia Procedure Notes (Cosign Needed Addendum)
 Procedure Name: Intubation Date/Time: 04/04/2023 3:36 PM  Performed by: Mal Amabile, MDPre-anesthesia Checklist: Patient identified, Emergency Drugs available, Suction available and Patient being monitored Patient Re-evaluated:Patient Re-evaluated prior to induction Oxygen Delivery Method: Circle system utilized Preoxygenation: Pre-oxygenation with 100% oxygen Induction Type: IV induction Ventilation: Mask ventilation without difficulty Laryngoscope Size: Mac and 3 Grade View: Grade I Tube type: Oral Tube size: 7.0 mm Number of attempts: 1 Airway Equipment and Method: Stylet and Oral airway Placement Confirmation: ETT inserted through vocal cords under direct vision, positive ETCO2 and breath sounds checked- equal and bilateral Secured at: 20 cm Tube secured with: Tape Dental Injury: Teeth and Oropharynx as per pre-operative assessment

## 2023-04-04 NOTE — Anesthesia Postprocedure Evaluation (Signed)
 Anesthesia Post Note  Patient: Stephanie Fritz  Procedure(s) Performed: LAPAROSCOPIC FULGURATION OF ENDOMETRIOSIS, ENDOMETRIAL BIOPSY (Abdomen) LYSIS, ADHESIONS, LAPAROSCOPIC (Abdomen)     Patient location during evaluation: PACU Anesthesia Type: General Level of consciousness: awake and alert and oriented Pain management: pain level controlled Vital Signs Assessment: post-procedure vital signs reviewed and stable Respiratory status: spontaneous breathing, nonlabored ventilation and respiratory function stable Cardiovascular status: blood pressure returned to baseline and stable Postop Assessment: no apparent nausea or vomiting Anesthetic complications: no   No notable events documented.  Last Vitals:  Vitals:   04/04/23 1630 04/04/23 1645  BP: 114/65 118/82  Pulse: 62 71  Resp: 11 12  Temp:    SpO2: 100% 100%    Last Pain:  Vitals:   04/04/23 1645  PainSc: 0-No pain   Pain Goal:                   Farhaan Mabee A.

## 2023-04-04 NOTE — H&P (Signed)
 Stephanie Fritz is a 33 y.o. 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 Henderson Cloud, for tubal factor infertility.  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  She has already undergone IVF/ICSI with embryo cryopreservation.  Patient would like to preserve her childbearing potential.  Pertinent Gynecological History: Menses: flow is excessive with use of 3 pads or tampons on heaviest days Bleeding: dysfunctional uterine bleeding Contraception: none DES exposure: denies Blood transfusions: none Sexually transmitted diseases: no past history Previous GYN Procedures: IVF Last pap: normal    Menstrual History: Menarche age: 61 No LMP recorded.    Past Medical History:  Diagnosis Date   COVID july 22 -29- 2021   COVID 01/07/2020   sore throat x 7 days all symptoms resolved   Missed abortion    Wears glasses                     Past Surgical History:  Procedure Laterality Date   DILATION AND EVACUATION N/A 02/19/2020   Procedure: DILATATION AND EVACUATION;  Surgeon: Carrington Clamp, MD;  Location: [facility];  Service: Gynecology;  Laterality: N/A;   MOUTH SURGERY  yrs ago   SHOULDER SURGERY Right 2015             History reviewed. No pertinent family history. No hereditary disease.  No cancer of breast, ovary, uterus.  Social History   Socioeconomic History   Marital status: Married    Spouse name: Not on file   Number of children: Not on file   Years of education: Not on file   Highest education level: Not on file  Occupational History   Not on file  Tobacco Use   Smoking status: Never   Smokeless tobacco: Never  Vaping Use   Vaping status: Never Used  Substance and Sexual Activity   Alcohol use: Not Currently    Comment: socially   Drug use: No   Sexual activity: Yes  Other Topics Concern   Not on file  Social History Narrative   Not on file   Social Drivers of Health   Financial Resource Strain: Not on  file  Food Insecurity: Not on file  Transportation Needs: Not on file  Physical Activity: Not on file  Stress: Not on file  Social Connections: Not on file  Intimate Partner Violence: Not on file    No Known Allergies  Current Facility-Administered Medications on File Prior to Encounter  Medication Dose Route Frequency Provider Last Rate Last Admin   doxycycline (VIBRAMYCIN) 100 mg in dextrose 5 % 250 mL IVPB  100 mg Intravenous Q12H Carrington Clamp, MD   100 mg at 02/19/20 1212   Current Outpatient Medications on File Prior to Encounter  Medication Sig Dispense Refill   acetaminophen (TYLENOL) 500 MG tablet Take 1,000 mg by mouth every 6 (six) hours as needed for moderate pain (pain score 4-6).     metFORMIN (GLUCOPHAGE) 1000 MG tablet Take 1,000 mg by mouth 2 (two) times daily.     Multiple Vitamin (MULTIVITAMIN WITH MINERALS) TABS tablet Take 1 tablet by mouth daily.     Prenatal Vit-Fe Fumarate-FA (PRENATAL MULTIVITAMIN) TABS tablet Take 1 tablet by mouth daily.       Review of Systems  Constitutional: Negative.   HENT: Negative.   Eyes: Negative.   Respiratory: Negative.   Cardiovascular: Negative.   Gastrointestinal: Negative.   Genitourinary: Negative.   Musculoskeletal: Negative.   Skin: Negative.   Neurological: Negative.  Endo/Heme/Allergies: Negative.   Psychiatric/Behavioral: Negative.      Physical Exam  BP (!) 107/93   Pulse 91   Temp 98.8 F (37.1 C)   Resp 20   Ht 5\' 4"  (1.626 m)   Wt 68 kg   LMP 03/09/2023 Comment: UPT neg on 03/26  SpO2 97%   BMI 25.75 kg/m  Constitutional: She is oriented to person, place, and time. She appears well-developed and well-nourished.  HENT:  Head: Normocephalic and atraumatic.  Nose: Nose normal.  Mouth/Throat: Oropharynx is clear and moist. No oropharyngeal exudate.  Eyes: Conjunctivae normal and EOM are normal. Pupils are equal, round, and reactive to light. No scleral icterus.  Neck: Normal range of motion.  Neck supple. No tracheal deviation present. No thyromegaly present.  Cardiovascular: Normal rate.   Respiratory: Effort normal and breath sounds normal.  GI: Soft. Bowel sounds are normal. She exhibits no distension and no mass. There is no tenderness.  Lymphadenopathy:    She has no cervical adenopathy.  Neurological: She is alert and oriented to person, place, and time. She has normal reflexes.  Skin: Skin is warm.  Psychiatric: She has a normal mood and affect. Her behavior is normal. Judgment and thought content normal.   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  Bowel prep instructions were given.  All of patient's questions were answered.  She verbalized understanding.    Fermin Schwab, MD

## 2023-04-05 ENCOUNTER — Encounter (HOSPITAL_COMMUNITY): Payer: Self-pay | Admitting: Obstetrics and Gynecology

## 2023-04-09 LAB — SURGICAL PATHOLOGY

## 2023-06-29 NOTE — Op Note (Signed)
 Operative Note  Preoperative diagnosis: Dysmenorrhea, infertility, probable hydrosalpinx, probable endometriosis  Postoperative diagnosis: Endometriosis, stage I of peritoneum  Procedure: Laparoscopy, fulguration of of peritoneal lesions, endometrial biopsy  Anesthesia: Gen. Endotracheal  Surgeon: Cynthia Loss, MD  Complications: None  Estimated blood loss: <10 cc  Specimens: Endometrial biopsy to pathology.  Findings: On laparoscopy, upper abdomen, liver surface and diaphragm surfaces were normal. Gallbladder was normal. The appendix was visualized. There were pericecal adhesions. The pelvic peritoneum looked mostly normal. There were stellate fibrotic spots medial to left uterosacral ligament, consistent with endometriosis and they were ablated. There was no other area suspicious for endometriosis.  Both fallopian tubes were normal. The fimbria appeared within normal limits. Both ovaries appeared normal as well as the ovarian fossae.   Description of the procedure: The patient was placed in dorsal supine position and general endotracheal anesthesia was given. 2 g of cefazolin  were given intravenously for prophylaxis. Patient was placed in lithotomy position. She was prepped and draped inside manner.  The bladder was catheterized with a Foley catheter. Pipelle was used to obtain an endometrial sample.  A ZUMI catheter was placed into the uterine cavity. The surgeon was regloved and a surgical field was created on the abdomen.  After preemptive anesthesia of all surgical sites with 0.25% bupivacaine , a 5 mm intraumbilical skin incision was made and a Verress needle was inserted. Its correct location was confirmed. A pneumoperitoneum was created with carbon dioxide.  5 mm laparoscope with a 30 lens was inserted and video laparoscopy was started . A left lower quadrant 5 mm and a right lower quadrant  5 mm incisions were made and ancillary trochars were placed under direct visualization.  Above findings were noted. Using Harmonic scalpel, the posterior cul de sac lesions were fulgurated. Pelvis was irrigated and aspirated. Instrument and pad count was correct x 2. Gas was allowed to escape. Incisions were closed with 4-0 Monocryl subcuticular sutures. The skin edges were approximated with Dermabond.   The patient tolerated the procedure well and was transferred to recovery room in satisfactory condition.   Devanie Galanti, MD
